# Patient Record
Sex: Female | Born: 1980 | Race: White | Hispanic: No | Marital: Married | Smoking: Never smoker
Health system: Southern US, Community
[De-identification: ages and names within clinical notes are randomized; demographics above are authoritative.]

## PROBLEM LIST (undated history)

## (undated) DIAGNOSIS — G43909 Migraine, unspecified, not intractable, without status migrainosus: Secondary | ICD-10-CM

## (undated) DIAGNOSIS — T8859XA Other complications of anesthesia, initial encounter: Secondary | ICD-10-CM

## (undated) DIAGNOSIS — F32A Depression, unspecified: Secondary | ICD-10-CM

## (undated) DIAGNOSIS — R112 Nausea with vomiting, unspecified: Secondary | ICD-10-CM

## (undated) DIAGNOSIS — E785 Hyperlipidemia, unspecified: Secondary | ICD-10-CM

## (undated) DIAGNOSIS — K219 Gastro-esophageal reflux disease without esophagitis: Secondary | ICD-10-CM

## (undated) DIAGNOSIS — F329 Major depressive disorder, single episode, unspecified: Secondary | ICD-10-CM

## (undated) DIAGNOSIS — Z9889 Other specified postprocedural states: Secondary | ICD-10-CM

## (undated) DIAGNOSIS — Z87442 Personal history of urinary calculi: Secondary | ICD-10-CM

## (undated) DIAGNOSIS — I10 Essential (primary) hypertension: Secondary | ICD-10-CM

## (undated) DIAGNOSIS — Z808 Family history of malignant neoplasm of other organs or systems: Secondary | ICD-10-CM

## (undated) DIAGNOSIS — I499 Cardiac arrhythmia, unspecified: Secondary | ICD-10-CM

## (undated) DIAGNOSIS — F909 Attention-deficit hyperactivity disorder, unspecified type: Secondary | ICD-10-CM

## (undated) HISTORY — DX: Migraine, unspecified, not intractable, without status migrainosus: G43.909

## (undated) HISTORY — DX: Family history of malignant neoplasm of other organs or systems: Z80.8

## (undated) HISTORY — DX: Hyperlipidemia, unspecified: E78.5

## (undated) HISTORY — PX: MOLE REMOVAL: SHX2046

## (undated) HISTORY — DX: Attention-deficit hyperactivity disorder, unspecified type: F90.9

---

## 2003-08-08 ENCOUNTER — Other Ambulatory Visit: Admission: RE | Admit: 2003-08-08 | Discharge: 2003-08-08 | Payer: Self-pay | Admitting: Gynecology

## 2005-09-02 ENCOUNTER — Ambulatory Visit: Payer: Self-pay | Admitting: Internal Medicine

## 2005-11-26 ENCOUNTER — Other Ambulatory Visit: Admission: RE | Admit: 2005-11-26 | Discharge: 2005-11-26 | Payer: Self-pay | Admitting: Gynecology

## 2006-05-27 ENCOUNTER — Ambulatory Visit: Payer: Self-pay | Admitting: Internal Medicine

## 2006-05-29 ENCOUNTER — Ambulatory Visit: Payer: Self-pay | Admitting: Internal Medicine

## 2006-11-12 ENCOUNTER — Ambulatory Visit: Payer: Self-pay | Admitting: Internal Medicine

## 2007-01-12 ENCOUNTER — Other Ambulatory Visit: Admission: RE | Admit: 2007-01-12 | Discharge: 2007-01-12 | Payer: Self-pay | Admitting: Gynecology

## 2008-07-19 ENCOUNTER — Telehealth: Payer: Self-pay | Admitting: Internal Medicine

## 2008-07-20 ENCOUNTER — Ambulatory Visit: Payer: Self-pay | Admitting: Internal Medicine

## 2008-07-20 DIAGNOSIS — G43009 Migraine without aura, not intractable, without status migrainosus: Secondary | ICD-10-CM | POA: Insufficient documentation

## 2008-07-26 ENCOUNTER — Telehealth (INDEPENDENT_AMBULATORY_CARE_PROVIDER_SITE_OTHER): Payer: Self-pay | Admitting: *Deleted

## 2008-08-04 ENCOUNTER — Encounter: Payer: Self-pay | Admitting: Internal Medicine

## 2009-12-12 ENCOUNTER — Ambulatory Visit: Payer: Self-pay | Admitting: Internal Medicine

## 2009-12-12 DIAGNOSIS — L03039 Cellulitis of unspecified toe: Secondary | ICD-10-CM | POA: Insufficient documentation

## 2009-12-12 DIAGNOSIS — B351 Tinea unguium: Secondary | ICD-10-CM | POA: Insufficient documentation

## 2010-07-06 ENCOUNTER — Ambulatory Visit: Payer: Self-pay | Admitting: Internal Medicine

## 2010-07-06 DIAGNOSIS — H9209 Otalgia, unspecified ear: Secondary | ICD-10-CM | POA: Insufficient documentation

## 2010-07-10 ENCOUNTER — Ambulatory Visit: Payer: Self-pay | Admitting: Family Medicine

## 2010-07-10 DIAGNOSIS — J019 Acute sinusitis, unspecified: Secondary | ICD-10-CM | POA: Insufficient documentation

## 2010-07-12 ENCOUNTER — Telehealth: Payer: Self-pay | Admitting: Family Medicine

## 2010-09-15 ENCOUNTER — Emergency Department (HOSPITAL_COMMUNITY)
Admission: EM | Admit: 2010-09-15 | Discharge: 2010-09-15 | Payer: Self-pay | Source: Home / Self Care | Admitting: Family Medicine

## 2010-11-11 ENCOUNTER — Emergency Department (HOSPITAL_BASED_OUTPATIENT_CLINIC_OR_DEPARTMENT_OTHER)
Admission: EM | Admit: 2010-11-11 | Discharge: 2010-11-11 | Payer: Self-pay | Source: Home / Self Care | Admitting: Emergency Medicine

## 2010-12-11 NOTE — Assessment & Plan Note (Signed)
Summary: RIGHT EAR PAIN/RH.....   Vital Signs:  Patient profile:   30 year old female Weight:      141.8 pounds Temp:     98.4 degrees F oral Pulse rate:   72 / minute Resp:     16 per minute BP sitting:   102 / 68  (left arm) Cuff size:   regular  Vitals Entered By: Shonna Chock CMA (July 06, 2010 11:52 AM) CC: Constant right ear pain since last night, URI symptoms   CC:  Constant right ear pain since last night and URI symptoms.  History of Present Illness:        This is a 30 year old woman who presents with R otalgia since last night . The patient reports earache, but denies nasal congestion, sore throat, and productive cough.  The patient denies fever, dyspnea, and wheezing.  The patient denies itchy watery eyes, sneezing, and headache.  The patient denies the following risk factors for Strep sinusitis: unilateral facial pain, tooth pain, and tender adenopathy.  Rx: none. ? worse with b'fast this am.  Allergies: 1)  ! Codeine  Review of Systems ENT:  Denies decreased hearing, difficulty swallowing, hoarseness, and ringing in ears.  Physical Exam  General:  well-nourished,in no acute distress; alert,appropriate and cooperative throughout examination Ears:  External ear exam shows no significant lesions or deformities.  Otoscopic examination reveals clear canals, tympanic membranes are intact bilaterally without bulging, retraction, inflammation or discharge. Hearing is grossly normal bilaterally. Nose:  External nasal examination shows no deformity or inflammation. Nasal mucosa are pink and moist without lesions or exudates. Mouth:  Oral mucosa and oropharynx without lesions or exudates.  Teeth in good repair. Some pain with mastication Lungs:  Normal respiratory effort, chest expands symmetrically. Lungs are clear to auscultation, no crackles or wheezes. Cervical Nodes:  No lymphadenopathy noted Axillary Nodes:  No palpable lymphadenopathy   Impression &  Recommendations:  Problem # 1:  EAR PAIN, RIGHT (ICD-388.70) probable TMJ The following medications were removed from the medication list:    Cephalexin 500 Mg Caps (Cephalexin) .Marland Kitchen... 1 two times a day for skin infections  Complete Medication List: 1)  Relpax 40 Mg Tabs (Eletriptan hydrobromide) .... Take as directed 2)  Prenatal Vit  .Marland KitchenMarland Kitchen. 1 by mouth once daily 3)  Zonisamide 100 Mg Caps (Zonisamide) .... 2 by mouth at bedtime for migraine prevention 4)  Folic Acid 40mg   .... 1 by mouth once daily 5)  Celebrex 200 Mg Caps (Celecoxib) .Marland Kitchen.. 1 two times a day as needed  Patient Instructions: 1)  Soft diet ; heat to area three times a day as needed . Fill the ear drops only if signs  of infection appear. Prescriptions: CELEBREX 200 MG CAPS (CELECOXIB) 1 two times a day as needed  #12 x 0   Entered and Authorized by:   Marga Melnick MD   Signed by:   Marga Melnick MD on 07/06/2010   Method used:   Samples Given   RxID:   [id number]

## 2010-12-11 NOTE — Assessment & Plan Note (Signed)
Summary: SINUS INFECTION??/KN   Vital Signs:  Patient profile:   30 year old female Weight:      141.6 pounds O2 Sat:      98 % on Room air Temp:     99.0 degrees F oral Pulse rate:   90 / minute Pulse rhythm:   regular BP sitting:   120 / 74  (right arm)  Vitals Entered By: Almeta Monas CMA Duncan Dull) (July 10, 2010 2:54 PM)  O2 Flow:  Room air CC: c/o cough, congestion, sinus pressure and right ear pain, URI symptoms   History of Present Illness:       This is a 30 year old woman who presents with URI symptoms.  The symptoms began 1 week ago.  The patient complains of nasal congestion, purulent nasal discharge, sore throat, productive cough, earache, and sick contacts.  The patient denies fever, low-grade fever (<100.5 degrees), fever of 100.5-103 degrees, fever of 103.1-104 degrees, fever to >104 degrees, stiff neck, dyspnea, wheezing, rash, vomiting, diarrhea, use of an antipyretic, and response to antipyretic.  The patient also reports sneezing and headache.  The patient denies the following risk factors for Strep sinusitis: unilateral facial pain, unilateral nasal discharge, poor response to decongestant, double sickening, tooth pain, Strep exposure, tender adenopathy, and absence of cough.    Current Medications (verified): 1)  Relpax 40 Mg  Tabs (Eletriptan Hydrobromide) .... Take As Directed 2)  Prenatal Vit .Marland Kitchen.. 1 By Mouth Once Daily 3)  Zonisamide 100 Mg Caps (Zonisamide) .... 2 By Mouth At Bedtime For Migraine Prevention 4)  Folic Acid 40mg  .... 1 By Mouth Once Daily 5)  Celebrex 200 Mg Caps (Celecoxib) .Marland Kitchen.. 1 Two Times A Day As Needed 6)  Augmentin 875-125 Mg Tabs (Amoxicillin-Pot Clavulanate) .Marland Kitchen.. 1 By Mouth Two Times A Day 7)  Veramyst 27.5 Mcg/spray Susp (Fluticasone Furoate) .... 2 Sprays Each Nostril Once Daily 8)  Mucinex Dm 30-600 Mg Xr12h-Tab (Dextromethorphan-Guaifenesin) 9)  Vicks Nyquil Multi-Symptom 15-6.25-500 Mg/82ml Liqd  (Dm-Doxylamine-Acetaminophen)  Allergies (verified): 1)  ! Codeine  Past History:  Past medical, surgical, family and social histories (including risk factors) reviewed for relevance to current acute and chronic problems.  Family History: Reviewed history and no changes required.  Social History: Reviewed history and no changes required.  Review of Systems      See HPI  Physical Exam  General:  Well-developed,well-nourished,in no acute distress; alert,appropriate and cooperative throughout examination Ears:  External ear exam shows no significant lesions or deformities.  Otoscopic examination reveals clear canals, tympanic membranes are intact bilaterally without bulging, retraction, inflammation or discharge. Hearing is grossly normal bilaterally. Nose:  L frontal sinus tenderness, L maxillary sinus tenderness, R frontal sinus tenderness, and R maxillary sinus tenderness.   Mouth:  Oral mucosa and oropharynx without lesions or exudates.  Teeth in good repair. Neck:  supple and cervical lymphadenopathy.   Lungs:  Normal respiratory effort, chest expands symmetrically. Lungs are clear to auscultation, no crackles or wheezes. Heart:  Normal rate and regular rhythm. S1 and S2 normal without gallop, murmur, click, rub or other extra sounds. Skin:  Intact without suspicious lesions or rashes Psych:  Cognition and judgment appear intact. Alert and cooperative with normal attention span and concentration. No apparent delusions, illusions, hallucinations   Impression & Recommendations:  Problem # 1:  SINUSITIS - ACUTE-NOS (ICD-461.9)  Her updated medication list for this problem includes:    Augmentin 875-125 Mg Tabs (Amoxicillin-pot clavulanate) .Marland Kitchen... 1 by mouth two times a  day    Veramyst 27.5 Mcg/spray Susp (Fluticasone furoate) .Marland Kitchen... 2 sprays each nostril once daily    Mucinex Dm 30-600 Mg Xr12h-tab (Dextromethorphan-guaifenesin)    Vicks Nyquil Multi-symptom 15-6.25-500 Mg/64ml  Liqd (Dm-doxylamine-acetaminophen)  Instructed on treatment. Call if symptoms persist or worsen.   Complete Medication List: 1)  Relpax 40 Mg Tabs (Eletriptan hydrobromide) .... Take as directed 2)  Prenatal Vit  .Marland KitchenMarland Kitchen. 1 by mouth once daily 3)  Zonisamide 100 Mg Caps (Zonisamide) .... 2 by mouth at bedtime for migraine prevention 4)  Folic Acid 40mg   .... 1 by mouth once daily 5)  Celebrex 200 Mg Caps (Celecoxib) .Marland Kitchen.. 1 two times a day as needed 6)  Augmentin 875-125 Mg Tabs (Amoxicillin-pot clavulanate) .Marland Kitchen.. 1 by mouth two times a day 7)  Veramyst 27.5 Mcg/spray Susp (Fluticasone furoate) .... 2 sprays each nostril once daily 8)  Mucinex Dm 30-600 Mg Xr12h-tab (Dextromethorphan-guaifenesin) 9)  Vicks Nyquil Multi-symptom 15-6.25-500 Mg/29ml Liqd (Dm-doxylamine-acetaminophen)  Patient Instructions: 1)  Acute sinusitis symptoms for less than 10 days are not helped by antibiotics. Use warm moist compresses, and over the counter decongestants( only as directed). Call if no improvement in 5-7 days, sooner if increasing pain, fever, or new symptoms.  Prescriptions: AUGMENTIN 875-125 MG TABS (AMOXICILLIN-POT CLAVULANATE) 1 by mouth two times a day  #20 x 0   Entered and Authorized by:   Loreen Freud DO   Signed by:   Loreen Freud DO on 07/10/2010   Method used:   Electronically to        [facility] [phone]* (retail)       [address]       [address]       [place], [place]  [address]       Ph: [phone]       Fax: [phone]   RxID:   [id number]

## 2010-12-11 NOTE — Progress Notes (Signed)
Summary: yeast infection  Phone Note Call from Patient Call back at Home Phone [phone]   Caller: Patient Summary of Call: pt left VM that whenever she takes antibiotics she gets a yeast infection. pt would like to get a rx for 2 diflucan sent in to pharmacy.pls advise................Marland KitchenFelecia Deloach CMA  July 12, 2010 10:28 AM    Follow-up for Phone Call        diflucan 150  #2  1 by mouth x1,  may repeat 3 days as needed  Follow-up by: Loreen Freud DO,  July 12, 2010 10:34 AM  Additional Follow-up for Phone Call Additional follow up Details #1::        Patient notified. Additional Follow-up by: Lucious Groves CMA,  July 12, 2010 11:11 AM    New/Updated Medications: DIFLUCAN 150 MG TABS (FLUCONAZOLE) 1 by mouth once daily Prescriptions: DIFLUCAN 150 MG TABS (FLUCONAZOLE) 1 by mouth once daily  #2 x 1   Entered by:   Lucious Groves CMA   Authorized by:   Loreen Freud DO   Signed by:   Lucious Groves CMA on 07/12/2010   Method used:   Electronically to        [facility] [id number] (retail)       [address] [place], [place]  [address]       Ph: [phone]       Fax: [phone]   RxID:   [id number]

## 2010-12-11 NOTE — Assessment & Plan Note (Signed)
Summary: infected toenail/kdc   Vital Signs:  Patient profile:   30 year old female Weight:      131.6 pounds Temp:     98.1 degrees F oral Pulse rate:   72 / minute Resp:     12 per minute BP sitting:   102 / 66  (left arm) Cuff size:   large  Vitals Entered By: Shonna Chock (December 12, 2009 12:16 PM) CC: ? infection/fungus on middle toe/right foot Comments REVIEWED MED LIST, PATIENT AGREED DOSE AND INSTRUCTION CORRECT    CC:  ? infection/fungus on middle toe/right foot.  History of Present Illness: 3rd R toe infected with fungus since 09/201 after Pedicure in 08/10. Pus from subungal area sinnce 12/07/2009. OTC meds w/o relief; H2O2 resolved pus.  Allergies: 1)  ! Codeine  Review of Systems General:  Denies chills, fever, and sweats. Derm:  Complains of changes in color of skin.  Physical Exam  General:  well-nourished,in no acute distress; alert,appropriate and cooperative throughout examination Pulses:  R dorsalis pedis and posterior tibial pulses are full  Extremities:  Mild fungal change 3rd R toenail; essentally resolved skin changes laterally to nail   Impression & Recommendations:  Problem # 1:  ONYCHOMYCOSIS (ICD-110.1) mild  Problem # 2:  PARONYCHIA, TOE (ICD-681.11)  essentially resolved  Her updated medication list for this problem includes:    Cephalexin 500 Mg Caps (Cephalexin) .Marland Kitchen... 1 two times a day for skin infections  Complete Medication List: 1)  Relpax 40 Mg Tabs (Eletriptan hydrobromide) .... Take as directed 2)  Yasmin 28 3-0.03 Mg Tabs (Drospirenone-ethinyl estradiol) .Marland Kitchen.. 1 by mouth as directed 3)  Daily Multiple Vitamins Tabs (Multiple vitamin) .Marland Kitchen.. 1 by mouth once daily 4)  Magnesium 250 Mg Tabs (Magnesium) .Marland Kitchen.. 1 by mouth once daily 5)  Topamax 100 Mg Tabs (Topiramate) .Marland Kitchen.. 1 by mouth once daily 6)  Citalopram Hydrobromide 10 Mg Tabs (Citalopram hydrobromide) .Marland Kitchen.. 1 by mouth once daily 7)  Bactroban 2 % Crea (Mupirocin calcium)  .... Apply once daily as needed 8)  Cephalexin 500 Mg Caps (Cephalexin) .Marland Kitchen.. 1 two times a day for skin infections  Patient Instructions: 1)  Hair drier once daily until nail normal. Prescriptions: CEPHALEXIN 500 MG CAPS (CEPHALEXIN) 1 two times a day for skin infections  #14 x 0   Entered and Authorized by:   Marga Melnick MD   Signed by:   Marga Melnick MD on 12/12/2009   Method used:   Print then Give to Patient   RxID:   [id number] BACTROBAN 2 % CREA (MUPIROCIN CALCIUM) apply once daily as needed  #15 grams x 0   Entered and Authorized by:   Marga Melnick MD   Signed by:   Marga Melnick MD on 12/12/2009   Method used:   Print then Give to Patient   RxID:   [id number]

## 2011-01-21 LAB — URINE MICROSCOPIC-ADD ON

## 2011-01-21 LAB — URINALYSIS, ROUTINE W REFLEX MICROSCOPIC
Bilirubin Urine: NEGATIVE
Specific Gravity, Urine: 1.025 (ref 1.005–1.030)
pH: 5.5 (ref 5.0–8.0)

## 2011-04-24 ENCOUNTER — Encounter: Payer: Self-pay | Admitting: Internal Medicine

## 2011-04-24 ENCOUNTER — Ambulatory Visit (INDEPENDENT_AMBULATORY_CARE_PROVIDER_SITE_OTHER): Payer: BC Managed Care – PPO | Admitting: Internal Medicine

## 2011-04-24 VITALS — BP 120/70 | HR 72 | Wt 156.6 lb

## 2011-04-24 DIAGNOSIS — R002 Palpitations: Secondary | ICD-10-CM

## 2011-04-24 NOTE — Patient Instructions (Addendum)
An EVENT  Monitor will be scheduled if labs are normal & symptoms persist or progress.

## 2011-04-24 NOTE — Progress Notes (Signed)
  Subjective:    Patient ID: Rebecca Armstrong, female    DOB: Mar 20, 1981, 30 y.o.   MRN: [id number]  HPI Palpitations Onset:?2002; exacerbation 11/2010 w/o specific trigger Trigger:her father had prolonged illness 2000-2003 which was stressful; episodes are not worse with CVE Pattern:1-2 episodes for 20 seconds am & eve, especially  during 2 weeks pre menses. Symptoms are not daily & may not occur some weeks Constitutional: no fever, chills, sweats, weight loss, fatigue, sleep issues Cardiovascular:no chest pain, syncope, diaphoresis, claudication GI:no change in bowels, anorexia Derm:no skin, hair, or nail changes Neurologic:no tremor, numbnes#1 palpitations and tingling Psych:no  Significant anxiety, depression, panic attacks Endocrine:no hoarseness,difficulty swallowing, temperature intolerance (except when on migraine preventative she stayed cold) Possible triggers:no new medications,no excess intake of  stimulants(decongestants, diet pills, nicotine, caffeine)  FH: MGM CAD;MGM "hole in heart"; cousin died @35   while running, presumably from dysrrhythmia   Review of Systems     Objective:   Physical Exam  Gen.: Healthy & but well-nourished; in no acute distress Eyes: Extraocular motion intact; no lid lag or proptosis Neck: full ROM, no masses , thyroid normal Heart: Normal rhythm and rate without significant murmur, gallop, or extra heart sounds Lungs: Chest clear to auscultation without rales,rales, wheezes Neuro:Deep tendon reflexes are equal and within normal limits; no tremor  Skin: Warm and dry without significant lesions or rashes; no onycholysis Psych: Normally communicative and interactive; no abnormal mood or affect clinically.         Assessment & Plan:  #1 palpitations Plan: EKG, Ca++, Mg++, K+, TFTs Note: EKG revealed PACs. Stimulants should be avoided. If symptoms persist & labs are normalo; an Event Holter monitor would be necessary as episodes are erratic.

## 2011-04-25 LAB — MAGNESIUM: Magnesium: 2.2 mg/dL (ref 1.5–2.5)

## 2011-05-27 ENCOUNTER — Other Ambulatory Visit: Payer: Self-pay | Admitting: Internal Medicine

## 2011-07-11 ENCOUNTER — Other Ambulatory Visit (HOSPITAL_COMMUNITY): Payer: Self-pay | Admitting: Gynecology

## 2011-07-11 DIAGNOSIS — N979 Female infertility, unspecified: Secondary | ICD-10-CM

## 2011-07-22 ENCOUNTER — Ambulatory Visit (HOSPITAL_COMMUNITY): Payer: BC Managed Care – PPO

## 2011-07-25 ENCOUNTER — Ambulatory Visit (HOSPITAL_COMMUNITY)
Admission: RE | Admit: 2011-07-25 | Discharge: 2011-07-25 | Disposition: A | Discharge status: Home / Self Care | Payer: BC Managed Care – PPO | Source: Ambulatory Visit | Attending: Gynecology | Admitting: Gynecology

## 2011-07-25 DIAGNOSIS — N979 Female infertility, unspecified: Secondary | ICD-10-CM | POA: Insufficient documentation

## 2011-07-25 MED ORDER — IOHEXOL 300 MG/ML  SOLN: 10.0000 mL | Freq: Once | INTRAMUSCULAR | Status: AC | PRN

## 2011-11-12 HISTORY — PX: OTHER SURGICAL HISTORY: SHX169

## 2011-11-13 ENCOUNTER — Ambulatory Visit (INDEPENDENT_AMBULATORY_CARE_PROVIDER_SITE_OTHER): Payer: BC Managed Care – PPO | Admitting: Internal Medicine

## 2011-11-13 ENCOUNTER — Encounter: Payer: Self-pay | Admitting: Internal Medicine

## 2011-11-13 DIAGNOSIS — J069 Acute upper respiratory infection, unspecified: Secondary | ICD-10-CM

## 2011-11-13 DIAGNOSIS — J209 Acute bronchitis, unspecified: Secondary | ICD-10-CM

## 2011-11-13 MED ORDER — BENZONATATE 200 MG PO CAPS: 200.0000 mg | ORAL_CAPSULE | Freq: Two times a day (BID) | ORAL | Status: AC | PRN

## 2011-11-13 MED ORDER — AZITHROMYCIN 250 MG PO TABS: ORAL_TABLET | ORAL | Status: AC

## 2011-11-13 NOTE — Patient Instructions (Signed)
Plain Mucinex for thick secretions ;force NON dairy fluids. Use a Neti pot daily as needed for sinus congestion  

## 2011-11-13 NOTE — Progress Notes (Signed)
  Subjective:    Patient ID: Rebecca Armstrong, female    DOB: 1981-10-24, 31 y.o.   MRN: [id number]  HPI Respiratory tract infection Onset/symptoms:12/29 as scratchy throat with dry cough Exposures (illness/environmental/extrinsic):ill family members Progression of symptoms:to malaise , temp to 100 Treatments/response:Mucinex helps Present symptoms: Fever/chills/sweats:no Frontal headache:no Facial pain:no Nasal purulence:no Sore throat:minor Dental pain:some 12/31 Lymphadenopathy:no Wheezing/shortness of breath:minor SOB this am Cough/sputum/hemoptysis:sputum not visualized Pleuritic pain:no Associated extrinsic/allergic symptoms:itchy eyes/ sneezing:no Past medical history: Seasonal allergies: yes/asthma:no Smoking history:no           Review of Systems     Objective:   Physical Exam General appearance:good health ;well nourished; no acute distress or increased work of breathing is present.  No  lymphadenopathy about the head, neck, or axilla noted.   Eyes: No conjunctival inflammation or lid edema is present.   Ears:  External ear exam shows no significant lesions or deformities.  Otoscopic examination reveals clear canals, tympanic membranes are intact bilaterally without bulging, retraction, inflammation or discharge.  Nose:  External nasal examination shows no deformity or inflammation. Nasal mucosa are dry without lesions or exudates. No septal dislocation or deviation.No obstruction to airflow.  Slight hyponasal speech  Oral exam: Dental hygiene is good; lips and gums are healthy appearing.There is no oropharyngeal erythema or exudate noted.     Heart:  Normal rate and regular rhythm. S1 and S2 normal without gallop, murmur, click, rub or other extra sounds.   Lungs:Chest clear to auscultation; no wheezes, rhonchi,rales ,or rubs present.No increased work of breathing.  Dry cough  Extremities:  No cyanosis, edema, or clubbing  noted    Skin: Warm & dry           Assessment & Plan:   #1 bronchitis, acute. Atypical organism suggested. Upper respiratory tract symptoms without definitive rhinosinusitis criteria  Plan: See orders recommendations

## 2011-11-20 ENCOUNTER — Other Ambulatory Visit: Payer: Self-pay | Admitting: Internal Medicine

## 2011-11-20 MED ORDER — ELETRIPTAN HYDROBROMIDE 40 MG PO TABS: ORAL_TABLET | ORAL | Status: DC

## 2011-11-20 NOTE — Telephone Encounter (Signed)
RX sent

## 2012-02-14 ENCOUNTER — Other Ambulatory Visit: Payer: Self-pay | Admitting: Internal Medicine

## 2012-02-26 LAB — OB RESULTS CONSOLE HEPATITIS B SURFACE ANTIGEN: Hepatitis B Surface Ag: NEGATIVE

## 2012-06-08 IMAGING — RF DG HYSTEROGRAM
2 series · 2 of 2 positions shown · non-contrast
Comparison: none

CLINICAL DATA: Infertility.

HYSTEROSALPINGOGRAM
TECHNIQUE: Hysterosalpingogram was performed by the ordering
physician under fluoroscopy.  Fluoroscopic images are submitted for
interpretation following the procedure.
Fluoroscopy Time:  2.0 minutes.

[Series 1: run · 1 of 1 slices shown (1 of 2)]
[im 1/1]
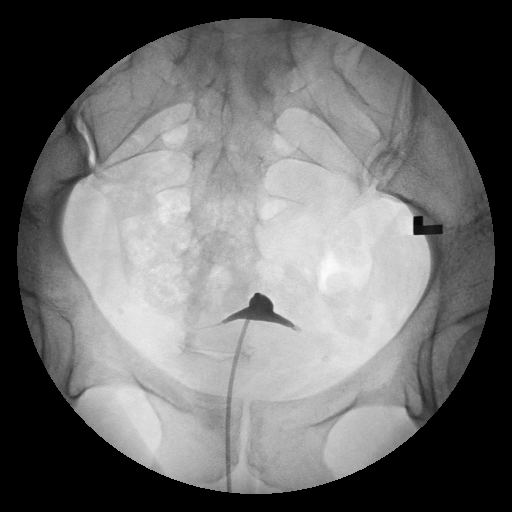

[Series 2: run · 1 of 1 slices shown (2 of 2)]
[im 1/1]
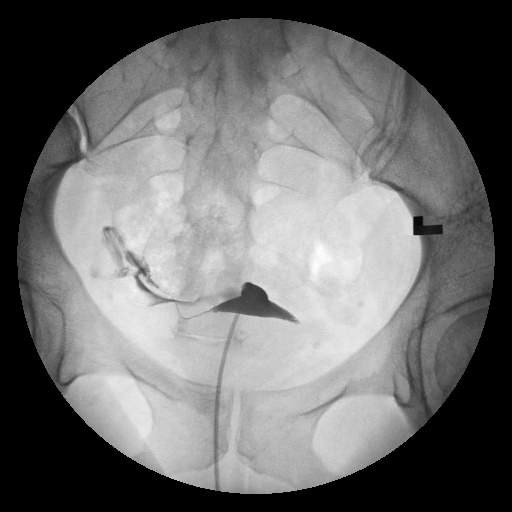

[2 of 2 positions shown; findings below may reference images not displayed]

FINDINGS: Only two images were submitted for evaluation.  A normal
endometrial morphology is noted.  The right fallopian tube has a
normal morphology and free intraperitoneal spill is seen on this
side with no loculation of contrast to suggest the presence of
peritubal or periovarian adhesions.

No filling of the left fallopian tube is noted past the level of
the cornual-tubal junction.  This can be the result of complete
occlusion but may also be seen with unilateral spasm.
IMPRESSION: Normal endometrial morphology and right tubal patency confirmed.
No filling beyond the left cornual-tubal junction, question spasm
versus occlusion

## 2012-08-22 ENCOUNTER — Encounter (HOSPITAL_COMMUNITY): Payer: Self-pay | Admitting: Pharmacist

## 2012-08-25 ENCOUNTER — Inpatient Hospital Stay (HOSPITAL_COMMUNITY): Admission: AD | Admit: 2012-08-25 | Payer: Self-pay | Source: Ambulatory Visit | Admitting: Obstetrics and Gynecology

## 2012-08-28 ENCOUNTER — Encounter (HOSPITAL_COMMUNITY): Payer: Self-pay

## 2012-08-28 ENCOUNTER — Encounter (HOSPITAL_COMMUNITY)
Admission: RE | Admit: 2012-08-28 | Discharge: 2012-08-28 | Disposition: A | Discharge status: Home / Self Care | Payer: BC Managed Care – PPO | Source: Ambulatory Visit | Attending: Obstetrics and Gynecology | Admitting: Obstetrics and Gynecology

## 2012-08-28 HISTORY — DX: Cardiac arrhythmia, unspecified: I49.9

## 2012-08-28 LAB — CBC
HCT: 32.7 % — ABNORMAL LOW (ref 36.0–46.0)
Hemoglobin: 10.6 g/dL — ABNORMAL LOW (ref 12.0–15.0)
RBC: 4.31 MIL/uL (ref 3.87–5.11)
RDW: 15.5 % (ref 11.5–15.5)
WBC: 6.5 10*3/uL (ref 4.0–10.5)

## 2012-08-28 LAB — SURGICAL PCR SCREEN
MRSA, PCR: NEGATIVE
Staphylococcus aureus: POSITIVE — AB

## 2012-08-28 LAB — TYPE AND SCREEN: Antibody Screen: NEGATIVE

## 2012-08-28 NOTE — Patient Instructions (Addendum)
   Your procedure is scheduled AV:[id number] October 22nd  Enter through the Main Entrance of [facility] at:8:30am Pick up the phone at the desk and dial [phone] and inform us of your arrival.  Please call this number if you have any problems the morning of surgery: [phone]  Remember: Do not eat or drink anything after midnight on Monday   Do not wear jewelry, make-up, or FINGER nail polish No metal in your hair or on your body. Do not wear lotions, powders, perfumes. You may wear deodorant.  Please use your CHG wash as directed prior to surgery.  Do not shave anywhere for at least 12 hours prior to first CHG shower.  Do not bring valuables to the hospital.   Leave suitcase in the car. After Surgery it may be brought to your room. For patients being admitted to the hospital, checkout time is 11:00am the day of discharge.

## 2012-08-30 NOTE — H&P (Signed)
Rebecca Armstrong is a 31 y.o. female G1P0 at 37+weeks (EDD 09/21/12 by known date of conception with IVF) presenting for scheduled primary c-section given vtx/breech twins.  She has had a relatively uncomplicated prenatal course, with di/di IVF twins.  Twin A has had mild pyelectasis on [place] that has been stable and will need to be followed postnatally. Growth has been concordant.  Maternal Medical History:  Fetal activity: Perceived fetal activity is normal.      OB History    Grav Para Term Preterm Abortions TAB SAB Ect Mult Living   1         0     Past Medical History  Diagnosis Date  . Newborn product of IVF pregnancy   . Headache   . Dysrhythmia     history of palpitations-ekg and workup in 04/2011--NSR with PAC's-avoid stimulants   Past Surgical History  Procedure Date  . Egg retrieval 2013    IVF-Propofol   Family History: family history is not on file. Social History:  reports that she has never smoked. She does not have any smokeless tobacco history on file. She reports that she does not drink alcohol or use illicit drugs.   Prenatal Transfer Tool  Maternal Diabetes: No Genetic Screening: Normal Maternal Ultrasounds/Referrals: Abnormal:  Findings:   Fetal renal pyelectasis--Twin "a"  Needs postnatal f/u Fetal Ultrasounds or other Referrals:  None Maternal Substance Abuse:  No Significant Maternal Medications:  None Significant Maternal Lab Results:  None Other Comments:  None  Review of Systems  Neurological: Negative for headaches.      Height 5\' 4"  (1.626 m), weight 95.255 kg (210 lb), last menstrual period 12/18/2011. Maternal Exam:  Uterine Assessment: Contraction strength is mild.  Contraction frequency is irregular.   Abdomen: Patient reports no abdominal tenderness. Introitus: Normal vulva. Normal vagina.    Physical Exam  Constitutional: She is oriented to person, place, and time. She appears well-developed and well-nourished.  Cardiovascular: Normal  rate and regular rhythm.   Respiratory: Effort normal and breath sounds normal.  GI: Soft. Bowel sounds are normal.  Genitourinary: Vagina normal and uterus normal.  Neurological: She is alert and oriented to person, place, and time.  Psychiatric: She has a normal mood and affect. Her behavior is normal.    Prenatal labs: ABO, Rh: --/--/O POS (10/18 1215) Antibody: NEG (10/18 1210) Rubella: Immune (04/17 0000) RPR: NON REACTIVE (10/18 1210)  HBsAg: Negative (04/17 0000)  HIV: Non-reactive (04/17 0000)  GBS:   negative One hour  GTT 145 Three hour GTT WNL  Assessment/Plan: Pt was counseled as to her options of vaginal deivery with breech extraction of twin B vs c-section.  After careful consideration, she would like to proceed with C-section.  We discussed the procedure in detail, including risks of bleeding, infection and possible damage to bowel and bladder. Pt ready to proceed.  Oliver Pila 08/30/2012, 11:49 AM

## 2012-09-01 ENCOUNTER — Encounter (HOSPITAL_COMMUNITY): Payer: Self-pay | Admitting: *Deleted

## 2012-09-01 ENCOUNTER — Encounter (HOSPITAL_COMMUNITY)
Admission: AD | Disposition: A | Discharge status: Home / Self Care | Payer: Self-pay | Source: Ambulatory Visit | Attending: Obstetrics and Gynecology

## 2012-09-01 ENCOUNTER — Inpatient Hospital Stay (HOSPITAL_COMMUNITY)
Admission: AD | Admit: 2012-09-01 | Discharge: 2012-09-04 | DRG: 371 | Disposition: A | Discharge status: Home / Self Care | Payer: BC Managed Care – PPO | Source: Ambulatory Visit | Attending: Obstetrics and Gynecology | Admitting: Obstetrics and Gynecology

## 2012-09-01 ENCOUNTER — Inpatient Hospital Stay (HOSPITAL_COMMUNITY): Payer: BC Managed Care – PPO | Admitting: Registered Nurse

## 2012-09-01 ENCOUNTER — Encounter (HOSPITAL_COMMUNITY): Payer: Self-pay | Admitting: Registered Nurse

## 2012-09-01 DIAGNOSIS — O358XX0 Maternal care for other (suspected) fetal abnormality and damage, not applicable or unspecified: Secondary | ICD-10-CM | POA: Diagnosis present

## 2012-09-01 DIAGNOSIS — O30009 Twin pregnancy, unspecified number of placenta and unspecified number of amniotic sacs, unspecified trimester: Secondary | ICD-10-CM | POA: Diagnosis present

## 2012-09-01 DIAGNOSIS — O3090 Multiple gestation, unspecified, unspecified trimester: Principal | ICD-10-CM | POA: Diagnosis present

## 2012-09-01 DIAGNOSIS — O30049 Twin pregnancy, dichorionic/diamniotic, unspecified trimester: Secondary | ICD-10-CM

## 2012-09-01 DIAGNOSIS — Z98891 History of uterine scar from previous surgery: Secondary | ICD-10-CM

## 2012-09-01 LAB — CBC
Hemoglobin: 10.6 g/dL — ABNORMAL LOW (ref 12.0–15.0)
MCH: 24.8 pg — ABNORMAL LOW (ref 26.0–34.0)
MCV: 76.1 fL — ABNORMAL LOW (ref 78.0–100.0)
RBC: 4.27 MIL/uL (ref 3.87–5.11)

## 2012-09-01 LAB — TYPE AND SCREEN: ABO/RH(D): O POS

## 2012-09-01 SURGERY — Surgical Case
Anesthesia: Spinal | Site: Abdomen | Wound class: Clean Contaminated

## 2012-09-01 MED ORDER — KETOROLAC TROMETHAMINE 60 MG/2ML IM SOLN
INTRAMUSCULAR | Status: AC
  Administered 2012-09-01: 60 mg
  Filled 2012-09-01: qty 2

## 2012-09-01 MED ORDER — ONDANSETRON HCL 4 MG/2ML IJ SOLN: 4.0000 mg | Freq: Three times a day (TID) | INTRAMUSCULAR | Status: DC | PRN

## 2012-09-01 MED ORDER — CEFAZOLIN SODIUM-DEXTROSE 2-3 GM-% IV SOLR
INTRAVENOUS | Status: AC
  Filled 2012-09-01: qty 50

## 2012-09-01 MED ORDER — HYDROMORPHONE HCL PF 1 MG/ML IJ SOLN
INTRAMUSCULAR | Status: AC
  Administered 2012-09-01: 0.5 mg via INTRAVENOUS
  Filled 2012-09-01: qty 1

## 2012-09-01 MED ORDER — PHENYLEPHRINE HCL 10 MG/ML IJ SOLN
INTRAMUSCULAR | Status: DC | PRN
  Administered 2012-09-01: 80 ug via INTRAVENOUS

## 2012-09-01 MED ORDER — CEFAZOLIN SODIUM-DEXTROSE 2-3 GM-% IV SOLR
2.0000 g | INTRAVENOUS | Status: AC
  Administered 2012-09-01: 2 g via INTRAVENOUS

## 2012-09-01 MED ORDER — LACTATED RINGERS IV SOLN
INTRAVENOUS | Status: DC | PRN
  Administered 2012-09-01: 10:00:00 via INTRAVENOUS

## 2012-09-01 MED ORDER — ONDANSETRON HCL 4 MG/2ML IJ SOLN: 4.0000 mg | INTRAMUSCULAR | Status: DC | PRN

## 2012-09-01 MED ORDER — KETOROLAC TROMETHAMINE 30 MG/ML IJ SOLN: 30.0000 mg | Freq: Four times a day (QID) | INTRAMUSCULAR | Status: AC | PRN

## 2012-09-01 MED ORDER — SIMETHICONE 80 MG PO CHEW
80.0000 mg | CHEWABLE_TABLET | Freq: Three times a day (TID) | ORAL | Status: DC
  Administered 2012-09-01 – 2012-09-04 (×9): 80 mg via ORAL

## 2012-09-01 MED ORDER — ZOLPIDEM TARTRATE 5 MG PO TABS: 5.0000 mg | ORAL_TABLET | Freq: Every evening | ORAL | Status: DC | PRN

## 2012-09-01 MED ORDER — DIPHENHYDRAMINE HCL 50 MG/ML IJ SOLN: 12.5000 mg | INTRAMUSCULAR | Status: DC | PRN

## 2012-09-01 MED ORDER — NALBUPHINE HCL 10 MG/ML IJ SOLN
5.0000 mg | INTRAMUSCULAR | Status: DC | PRN
  Filled 2012-09-01: qty 1

## 2012-09-01 MED ORDER — SODIUM CHLORIDE 0.9 % IJ SOLN: 3.0000 mL | INTRAMUSCULAR | Status: DC | PRN

## 2012-09-01 MED ORDER — PHENYLEPHRINE 40 MCG/ML (10ML) SYRINGE FOR IV PUSH (FOR BLOOD PRESSURE SUPPORT)
PREFILLED_SYRINGE | INTRAVENOUS | Status: AC
  Filled 2012-09-01: qty 5

## 2012-09-01 MED ORDER — BUPIVACAINE IN DEXTROSE 0.75-8.25 % IT SOLN
INTRATHECAL | Status: DC | PRN
  Administered 2012-09-01: 1.4 mL via INTRATHECAL

## 2012-09-01 MED ORDER — OXYTOCIN 10 UNIT/ML IJ SOLN
INTRAMUSCULAR | Status: AC
Start: 2012-09-01 — End: 2012-09-01
  Filled 2012-09-01: qty 4

## 2012-09-01 MED ORDER — SENNOSIDES-DOCUSATE SODIUM 8.6-50 MG PO TABS
2.0000 | ORAL_TABLET | Freq: Every day | ORAL | Status: DC
  Administered 2012-09-01 – 2012-09-03 (×3): 2 via ORAL

## 2012-09-01 MED ORDER — HYDROCORTISONE ACE-PRAMOXINE 1-1 % RE FOAM
1.0000 | Freq: Two times a day (BID) | RECTAL | Status: DC
  Administered 2012-09-01: 1 via RECTAL
  Filled 2012-09-01: qty 10

## 2012-09-01 MED ORDER — DIPHENHYDRAMINE HCL 25 MG PO CAPS
25.0000 mg | ORAL_CAPSULE | ORAL | Status: DC | PRN
  Filled 2012-09-01: qty 1

## 2012-09-01 MED ORDER — METOCLOPRAMIDE HCL 5 MG/ML IJ SOLN: 10.0000 mg | Freq: Three times a day (TID) | INTRAMUSCULAR | Status: DC | PRN

## 2012-09-01 MED ORDER — LACTATED RINGERS IV SOLN
INTRAVENOUS | Status: DC
  Administered 2012-09-01 (×3): via INTRAVENOUS

## 2012-09-01 MED ORDER — SODIUM CHLORIDE 0.9 % IV SOLN: 1.0000 ug/kg/h | INTRAVENOUS | Status: DC | PRN

## 2012-09-01 MED ORDER — ONDANSETRON HCL 4 MG PO TABS
4.0000 mg | ORAL_TABLET | ORAL | Status: DC | PRN
  Administered 2012-09-02: 4 mg via ORAL
  Filled 2012-09-01: qty 1

## 2012-09-01 MED ORDER — SCOPOLAMINE 1 MG/3DAYS TD PT72
MEDICATED_PATCH | TRANSDERMAL | Status: AC
  Administered 2012-09-01: 1.5 mg via TRANSDERMAL
  Filled 2012-09-01: qty 1

## 2012-09-01 MED ORDER — OXYCODONE-ACETAMINOPHEN 5-325 MG PO TABS
1.0000 | ORAL_TABLET | ORAL | Status: DC | PRN
  Administered 2012-09-02 – 2012-09-03 (×3): 1 via ORAL
  Filled 2012-09-01 (×3): qty 1

## 2012-09-01 MED ORDER — EPHEDRINE 5 MG/ML INJ
INTRAVENOUS | Status: AC
  Filled 2012-09-01: qty 10

## 2012-09-01 MED ORDER — ONDANSETRON HCL 4 MG/2ML IJ SOLN
INTRAMUSCULAR | Status: AC
  Filled 2012-09-01: qty 2

## 2012-09-01 MED ORDER — LANOLIN HYDROUS EX OINT: 1.0000 "application " | TOPICAL_OINTMENT | CUTANEOUS | Status: DC | PRN

## 2012-09-01 MED ORDER — DIPHENHYDRAMINE HCL 25 MG PO CAPS: 25.0000 mg | ORAL_CAPSULE | Freq: Four times a day (QID) | ORAL | Status: DC | PRN

## 2012-09-01 MED ORDER — NALOXONE HCL 0.4 MG/ML IJ SOLN: 0.4000 mg | INTRAMUSCULAR | Status: DC | PRN

## 2012-09-01 MED ORDER — SCOPOLAMINE 1 MG/3DAYS TD PT72: 1.0000 | MEDICATED_PATCH | Freq: Once | TRANSDERMAL | Status: DC

## 2012-09-01 MED ORDER — MENTHOL 3 MG MT LOZG: 1.0000 | LOZENGE | OROMUCOSAL | Status: DC | PRN

## 2012-09-01 MED ORDER — FENTANYL CITRATE 0.05 MG/ML IJ SOLN
INTRAMUSCULAR | Status: DC | PRN
  Administered 2012-09-01: 25 ug via INTRATHECAL

## 2012-09-01 MED ORDER — FENTANYL CITRATE 0.05 MG/ML IJ SOLN
INTRAMUSCULAR | Status: AC
  Filled 2012-09-01: qty 2

## 2012-09-01 MED ORDER — PRENATAL MULTIVITAMIN CH
1.0000 | ORAL_TABLET | Freq: Every day | ORAL | Status: DC
  Administered 2012-09-02 – 2012-09-04 (×3): 1 via ORAL
  Filled 2012-09-01 (×3): qty 1

## 2012-09-01 MED ORDER — SCOPOLAMINE 1 MG/3DAYS TD PT72
1.0000 | MEDICATED_PATCH | Freq: Once | TRANSDERMAL | Status: DC
  Administered 2012-09-01: 1.5 mg via TRANSDERMAL

## 2012-09-01 MED ORDER — LACTATED RINGERS IV SOLN: INTRAVENOUS | Status: DC

## 2012-09-01 MED ORDER — MEPERIDINE HCL 25 MG/ML IJ SOLN: 6.2500 mg | INTRAMUSCULAR | Status: DC | PRN

## 2012-09-01 MED ORDER — IBUPROFEN 600 MG PO TABS
600.0000 mg | ORAL_TABLET | Freq: Four times a day (QID) | ORAL | Status: DC
  Administered 2012-09-01 – 2012-09-04 (×11): 600 mg via ORAL
  Filled 2012-09-01 (×11): qty 1

## 2012-09-01 MED ORDER — DIBUCAINE 1 % RE OINT: 1.0000 "application " | TOPICAL_OINTMENT | RECTAL | Status: DC | PRN

## 2012-09-01 MED ORDER — OXYTOCIN 40 UNITS IN LACTATED RINGERS INFUSION - SIMPLE MED
INTRAVENOUS | Status: DC | PRN
  Administered 2012-09-01: 40 [IU] via INTRAVENOUS

## 2012-09-01 MED ORDER — LACTATED RINGERS IV SOLN
Freq: Once | INTRAVENOUS | Status: AC
  Administered 2012-09-01: 09:00:00 via INTRAVENOUS

## 2012-09-01 MED ORDER — SIMETHICONE 80 MG PO CHEW: 80.0000 mg | CHEWABLE_TABLET | ORAL | Status: DC | PRN

## 2012-09-01 MED ORDER — EPHEDRINE SULFATE 50 MG/ML IJ SOLN
INTRAMUSCULAR | Status: DC | PRN
Start: 2012-09-01 — End: 2012-09-01
  Administered 2012-09-01: 5 mg via INTRAVENOUS
  Administered 2012-09-01: 10 mg via INTRAVENOUS

## 2012-09-01 MED ORDER — OXYTOCIN 40 UNITS IN LACTATED RINGERS INFUSION - SIMPLE MED: 62.5000 mL/h | INTRAVENOUS | Status: AC

## 2012-09-01 MED ORDER — MORPHINE SULFATE 0.5 MG/ML IJ SOLN
INTRAMUSCULAR | Status: AC
  Filled 2012-09-01: qty 10

## 2012-09-01 MED ORDER — IBUPROFEN 600 MG PO TABS: 600.0000 mg | ORAL_TABLET | Freq: Four times a day (QID) | ORAL | Status: DC | PRN

## 2012-09-01 MED ORDER — ONDANSETRON HCL 4 MG/2ML IJ SOLN
INTRAMUSCULAR | Status: DC | PRN
  Administered 2012-09-01: 4 mg via INTRAVENOUS

## 2012-09-01 MED ORDER — HYDROMORPHONE HCL PF 1 MG/ML IJ SOLN
0.2500 mg | INTRAMUSCULAR | Status: DC | PRN
  Administered 2012-09-01: 0.5 mg via INTRAVENOUS

## 2012-09-01 MED ORDER — KETOROLAC TROMETHAMINE 60 MG/2ML IM SOLN
60.0000 mg | Freq: Once | INTRAMUSCULAR | Status: AC | PRN
  Filled 2012-09-01: qty 2

## 2012-09-01 MED ORDER — MORPHINE SULFATE (PF) 0.5 MG/ML IJ SOLN
INTRAMUSCULAR | Status: DC | PRN
  Administered 2012-09-01: .15 mg via INTRATHECAL

## 2012-09-01 MED ORDER — WITCH HAZEL-GLYCERIN EX PADS: 1.0000 "application " | MEDICATED_PAD | CUTANEOUS | Status: DC | PRN

## 2012-09-01 MED ORDER — TETANUS-DIPHTH-ACELL PERTUSSIS 5-2.5-18.5 LF-MCG/0.5 IM SUSP: 0.5000 mL | Freq: Once | INTRAMUSCULAR | Status: DC

## 2012-09-01 MED ORDER — DIPHENHYDRAMINE HCL 50 MG/ML IJ SOLN: 25.0000 mg | INTRAMUSCULAR | Status: DC | PRN

## 2012-09-01 SURGICAL SUPPLY — 31 items
CLOTH BEACON ORANGE TIMEOUT ST (SAFETY) ×2 IMPLANT
CONTAINER PREFILL 10% NBF 15ML (MISCELLANEOUS) IMPLANT
DRAPE SURG 17X23 STRL (DRAPES) ×2 IMPLANT
DRSG COVADERM 4X10 (GAUZE/BANDAGES/DRESSINGS) ×2 IMPLANT
DURAPREP 26ML APPLICATOR (WOUND CARE) ×2 IMPLANT
ELECT REM PT RETURN 9FT ADLT (ELECTROSURGICAL) ×2
ELECTRODE REM PT RTRN 9FT ADLT (ELECTROSURGICAL) ×1 IMPLANT
EXTRACTOR VACUUM KIWI (MISCELLANEOUS) IMPLANT
EXTRACTOR VACUUM M CUP 4 TUBE (SUCTIONS) IMPLANT
GLOVE BIO SURGEON STRL SZ 6.5 (GLOVE) ×4 IMPLANT
GLOVE BIO SURGEON STRL SZ8 (GLOVE) ×2 IMPLANT
GOWN PREVENTION PLUS LG XLONG (DISPOSABLE) ×4 IMPLANT
KIT ABG SYR 3ML LUER SLIP (SYRINGE) IMPLANT
NEEDLE HYPO 25X5/8 SAFETYGLIDE (NEEDLE) ×2 IMPLANT
NS IRRIG 1000ML POUR BTL (IV SOLUTION) ×2 IMPLANT
PACK C SECTION WH (CUSTOM PROCEDURE TRAY) ×2 IMPLANT
PAD OB MATERNITY 4.3X12.25 (PERSONAL CARE ITEMS) IMPLANT
RTRCTR C-SECT PINK 25CM LRG (MISCELLANEOUS) ×2 IMPLANT
SLEEVE SCD COMPRESS KNEE MED (MISCELLANEOUS) IMPLANT
STAPLER VISISTAT 35W (STAPLE) IMPLANT
SUT CHROMIC 1 CTX 36 (SUTURE) ×4 IMPLANT
SUT PLAIN 0 NONE (SUTURE) IMPLANT
SUT PLAIN 2 0 XLH (SUTURE) IMPLANT
SUT VIC AB 0 CT1 27 (SUTURE) ×2
SUT VIC AB 0 CT1 27XBRD ANBCTR (SUTURE) ×2 IMPLANT
SUT VIC AB 2-0 CT1 27 (SUTURE)
SUT VIC AB 2-0 CT1 TAPERPNT 27 (SUTURE) IMPLANT
SUT VIC AB 4-0 KS 27 (SUTURE) IMPLANT
TOWEL OR 17X24 6PK STRL BLUE (TOWEL DISPOSABLE) ×4 IMPLANT
TRAY FOLEY CATH 14FR (SET/KITS/TRAYS/PACK) ×2 IMPLANT
WATER STERILE IRR 1000ML POUR (IV SOLUTION) ×2 IMPLANT

## 2012-09-01 NOTE — Anesthesia Postprocedure Evaluation (Signed)
  Anesthesia Post-op Note  Patient: Rebecca Armstrong  Procedure(s) Performed: Procedure(s) (LRB) with comments: CESAREAN SECTION (N/A) - Primay c/s-TWINS  Patient Location: PACU  Anesthesia Type: Spinal  Level of Consciousness: awake, alert  and oriented  Airway and Oxygen Therapy: Patient Spontanous Breathing  Post-op Pain: none  Post-op Assessment: Post-op Vital signs reviewed, Patient's Cardiovascular Status Stable, Respiratory Function Stable, Patent Airway, No signs of Nausea or vomiting, Pain level controlled, No headache, No backache, No residual numbness and No residual motor weakness  Post-op Vital Signs: Reviewed and stable  Complications: No apparent anesthesia complications

## 2012-09-01 NOTE — Op Note (Signed)
Operative report  Preop diagnosis Term pregnancy at 37+ weeks Twin gestation vertex breech lie  Postop diagnosis Same  Procedure Primary low transverse C-section with 2 layer closure of uterus  Surgeon Dr. Huel Cote Dr. Tracey Harries  Anesthesia Spinal  Fluids Estimated blood loss 800 cc Urine output 100 cc clear urine IV fluid 2200 cc LR  Findings A viable female infant twin A in the vertex presentation Apgars were 8 and 9, there is a nuchal cord x1 reduced. Weight pending at time of dictation. Twin B was actually transverse with back down and had to be rotated down to breech for delivery. Apgars were 7 and 9 of this viable female infant twin B. Weight pending at time of dictation. There were normal uterus tubes and ovaries noted. There is no clubbing or scar tissue noted at the tubes.  Specimen Placenta was sent to pathology  Procedure Patient was taken to the operating room after informed consent was obtained and spinal anesthesia obtained without difficulty. She was then  prepped and draped in the normal sterile fashion in the dorsal supine position with a leftward tilt. An appropriate time out was performed. The patient had a Foley catheter placed. A Pfannenstiel skin incision was then made and carried through to the underlying layer of fascia by sharp dissection and Bovie cautery. The fascia was then nicked in the midline and the incision extended laterally. The inferior aspect of the fashion was then grasped Coker clamps elevated and dissected off the underlying rectus muscles. In a similar fashion the superior aspect was dissected off the rectus muscles. Rectus muscles were then separated in the midline and the peritoneal cavity entered sharply. The incision was then extended both superiorly and inferiorly with careful attention to avoid both bowel bladder. The Alexis self-retaining wound retractor was then placed within the incision and the lower uterine segment exposed  nicely. The bladder flap was created with Metzenbaum scissors. The lower uterine segment was then incised in a transverse fashion. There were some large sinuses which bled at time of the uterine incision but these slowed down as the infant delivered. The bag was ruptured with clear fluid noted and the infant delivered in the vertex position without difficulty. Nose and mouth were bulb suctioned and infant was handed to the waiting pediatricians after the cord was clamped and cut. The second twin was then palpated in the bag and was found to be transverse back down the feet were grasped in the left fundus and pulled down to the level of the incision. Once this was performed the infant was delivered and gentle traction down to the level of the scapula the arms were distended upward and these were gently pulled down across the infant's chest. Finally the vertex was delivered in a flexed position. The cord was clamped and infant handed to the waiting pediatricians as well. In the cord blood was then obtained from twin A and twin B's cords and the placenta was delivered without difficulty. The uterus was cleared of all clots and debris with moist lap sponge and responded well to IV Pitocin. The uterine incision was then closed in 2 layers the first a running locked layer of 1-0 chromic and the second an imbricating layer of the same suture. The ovaries and tubes were then inspected and found to be normal the incision was carefully observed with no active bleeding noted therefore all instruments and sponges removed from the abdomen. The rectus muscles and peritoneum were reapproximated with several interrupted  mattress sutures of 2-0 Vicryl. The fascia was closed with 0 Vicryl in a running fashion. The subcutaneous tissue was reapproximated with a running suture of 3-0 plain. The skin was closed with 4-0 Vicryl on a Keith needle in a subcuticular stitch and reinforced with benzoin and Steri-Strips. The patient was then  taken to PACU in good condition with the baby's accompanying her and also doing well.

## 2012-09-01 NOTE — Transfer of Care (Signed)
Immediate Anesthesia Transfer of Care Note  Patient: Rebecca Armstrong  Procedure(s) Performed: Procedure(s) (LRB) with comments: CESAREAN SECTION (N/A) - Primay c/s-TWINS  Patient Location: PACU  Anesthesia Type: Spinal  Level of Consciousness: awake, alert  and oriented  Airway & Oxygen Therapy: Patient Spontanous Breathing  Post-op Assessment: Report given to PACU RN and Post -op Vital signs reviewed and stable  Post vital signs: Reviewed and stable  Complications: No apparent anesthesia complications

## 2012-09-01 NOTE — Addendum Note (Signed)
Addendum  created 09/01/12 1649 by Graciela Husbands, CRNA   Modules edited:Notes Section

## 2012-09-01 NOTE — Anesthesia Procedure Notes (Signed)
Spinal  Patient location during procedure: OR Start time: 09/01/2012 10:05 AM Staffing Anesthesiologist: Rosezella Kronick A. Performed by: anesthesiologist  Preanesthetic Checklist Completed: patient identified, site marked, surgical consent, pre-op evaluation, timeout performed, IV checked, risks and benefits discussed and monitors and equipment checked Spinal Block Patient position: sitting Prep: site prepped and draped and DuraPrep Patient monitoring: heart rate, cardiac monitor, continuous pulse ox and blood pressure Approach: midline Location: L3-4 Injection technique: single-shot Needle Needle type: Sprotte  Needle gauge: 24 G Needle length: 9 cm Needle insertion depth: 5 cm Assessment Sensory level: T4 Additional Notes Patient tolerated procedure well. Adequate sensory level.

## 2012-09-01 NOTE — Progress Notes (Signed)
Patient ID: Rebecca Armstrong, female   DOB: November 24, 1980, 32 y.o.   MRN: [id number] Per pt no changes in dictated H&P.  Brief exam WNL.

## 2012-09-01 NOTE — Anesthesia Preprocedure Evaluation (Signed)
Anesthesia Evaluation  Patient identified by MRN, date of birth, ID band Patient awake    Reviewed: Allergy & Precautions, H&P , Patient's Chart, lab work & pertinent test results  Airway Mallampati: II TM Distance: >3 FB Neck ROM: full    Dental No notable dental hx.    Pulmonary  breath sounds clear to auscultation  Pulmonary exam normal       Cardiovascular Exercise Tolerance: Good - dysrhythmias (rare PVC's) Rhythm:regular Rate:Normal     Neuro/Psych    GI/Hepatic   Endo/Other  Morbid obesity  Renal/GU      Musculoskeletal   Abdominal   Peds  Hematology   Anesthesia Other Findings Migraines O/w recently well  Reproductive/Obstetrics                           Anesthesia Physical Anesthesia Plan  ASA: III  Anesthesia Plan: Spinal   Post-op Pain Management:    Induction:   Airway Management Planned:   Additional Equipment:   Intra-op Plan:   Post-operative Plan:   Informed Consent: I have reviewed the patients History and Physical, chart, labs and discussed the procedure including the risks, benefits and alternatives for the proposed anesthesia with the patient or authorized representative who has indicated his/her understanding and acceptance.   Dental Advisory Given  Plan Discussed with: CRNA  Anesthesia Plan Comments: (Lab work confirmed with CRNA in room. Platelets okay. Discussed spinal anesthetic, and patient consents to the procedure:  included risk of possible headache,backache, failed block, allergic reaction, and nerve injury. This patient was asked if she had any questions or concerns before the procedure started. )        Anesthesia Quick Evaluation

## 2012-09-01 NOTE — Anesthesia Postprocedure Evaluation (Signed)
  Anesthesia Post-op Note  Patient: Rebecca Armstrong  Procedure(s) Performed: Procedure(s) (LRB) with comments: CESAREAN SECTION (N/A) - Primay c/s-TWINS  Patient Location: Mother/Baby  Anesthesia Type: Spinal  Level of Consciousness: awake, alert  and oriented  Airway and Oxygen Therapy: Patient Spontanous Breathing  Post-op Pain: mild  Post-op Assessment: Post-op Vital signs reviewed, Patient's Cardiovascular Status Stable, No headache, No backache, No residual numbness and No residual motor weakness  Post-op Vital Signs: Reviewed and stable  Complications: No apparent anesthesia complications

## 2012-09-01 NOTE — Brief Op Note (Signed)
09/01/2012  11:07 AM  PATIENT:  Rebecca Armstrong  31 y.o. female  PRE-OPERATIVE DIAGNOSIS:  vertex/breech/Twins  POST-OPERATIVE DIAGNOSIS:  Vertex/breech twins  PROCEDURE:  Procedure(s) (LRB) with comments: CESAREAN SECTION (N/A) - Primay c/s-TWINS  SURGEON:  Surgeon(s) and Role:    * Oliver Pila, MD - Primary    * Bing Plume, MD - Assisting   ANESTHESIA:   spinal  EBL:  Total I/O In: 3100 [I.V.:3100] Out: 875 [Urine:75; Blood:800]  BLOOD ADMINISTERED:none  DRAINS: Urinary Catheter (Foley)   LOCAL MEDICATIONS USED:  NONE  SPECIMEN:  Placenta  DISPOSITION OF SPECIMEN:  PATHOLOGY  COUNTS:  YES  TOURNIQUET:  * No tourniquets in log *  DICTATION: .Dragon Dictation  PLAN OF CARE: Admit to inpatient   PATIENT DISPOSITION:  PACU - hemodynamically stable.

## 2012-09-02 ENCOUNTER — Encounter (HOSPITAL_COMMUNITY): Payer: Self-pay | Admitting: Obstetrics and Gynecology

## 2012-09-02 LAB — CBC
HCT: 27.4 % — ABNORMAL LOW (ref 36.0–46.0)
MCH: 24.9 pg — ABNORMAL LOW (ref 26.0–34.0)
MCHC: 32.5 g/dL (ref 30.0–36.0)
MCV: 76.5 fL — ABNORMAL LOW (ref 78.0–100.0)
Platelets: 182 10*3/uL (ref 150–400)
RDW: 15.6 % — ABNORMAL HIGH (ref 11.5–15.5)
WBC: 8.5 10*3/uL (ref 4.0–10.5)

## 2012-09-02 NOTE — Discharge Summary (Addendum)
Obstetric Discharge Summary Reason for Admission: cesarean section Prenatal Procedures: ultrasound Intrapartum Procedures: cesarean: low cervical, transverse Postpartum Procedures: none Complications-Operative and Postpartum: none Hemoglobin  Date Value Range Status  09/02/2012 8.9* 12.0 - 15.0 g/dL Final     HCT  Date Value Range Status  09/02/2012 27.4* 36.0 - 46.0 % Final    Physical Exam:  General: alert and cooperative Lochia: appropriate Uterine Fundus: firm Incision: healing well, no significant drainage   Discharge Diagnoses: Term Pregnancy-delivered                                         Twin gestation at 37+ weeks (vertex/transverse) Discharge Information: Date: 09/04/2012 Activity: pelvic rest Diet: routine Medications: Ibuprofen and Percocet Condition: improved Instructions: refer to practice specific booklet Discharge to: home Follow-up Information    Follow up with Oliver Pila, MD. Schedule an appointment as soon as possible for a visit in 2 weeks. (incision check)    Contact information:   [address], [address] [place] [address] [phone]          Newborn Data:   Avie, Checo [[id number]]  Live born female  Birth Weight: 6 lb 0.1 oz (2725 g) APGAR: 8, 9   Marceil, Welp [[id number]]  Live born female  Birth Weight: 6 lb 8.2 oz (2955 g) APGAR: 7, 9  Home with mother.  Oliver Pila 09/04/2012, 9:19 AM

## 2012-09-02 NOTE — Progress Notes (Signed)
Subjective: Postpartum Day 1 Cesarean Delivery Patient reports tolerating PO and no problems voiding.  Working on breastfeeding  Objective: Vital signs in last 24 hours: Temp:  [97.2 F (36.2 C)-98.9 F (37.2 C)] 97.2 F (36.2 C) (10/23 0554) Pulse Rate:  [63-121] 91  (10/23 0554) Resp:  [14-20] 18  (10/23 0554) BP: (115-157)/(60-93) 124/81 mmHg (10/23 0554) SpO2:  [93 %-100 %] 98 % (10/23 0200)  Physical Exam:  General: alert and cooperative Lochia: appropriate Uterine Fundus: firm Incision: C/D/I  Basename 09/02/12 0535 09/01/12 0818  HGB 8.9* 10.6*  HCT 27.4* 32.5*    Assessment/Plan: Status post Cesarean section. Doing well postoperatively.  Continue current care.  Oliver Pila 09/02/2012, 8:42 AM

## 2012-09-03 NOTE — Progress Notes (Signed)
Subjective: Postpartum Day 2 Cesarean Delivery Patient reports tolerating PO, + flatus and no problems voiding.    Objective: Vital signs in last 24 hours: Temp:  [97.6 F (36.4 C)-98 F (36.7 C)] 97.6 F (36.4 C) (10/24 0600) Pulse Rate:  [79-99] 79  (10/24 0600) Resp:  [18-20] 20  (10/24 0600) BP: (123-137)/(77-85) 123/80 mmHg (10/24 0600) SpO2:  [96 %] 96 % (10/24 0600)  Physical Exam:  General: alert and cooperative Lochia: appropriate Uterine Fundus: firm Incision: healing well, no significant drainage    Basename 09/02/12 0535 09/01/12 0818  HGB 8.9* 10.6*  HCT 27.4* 32.5*    Assessment/Plan: Status post Cesarean section. Doing well postoperatively.  Continue current care.  Oliver Pila 09/03/2012, 8:43 AM

## 2012-09-04 MED ORDER — IBUPROFEN 600 MG PO TABS: 600.0000 mg | ORAL_TABLET | Freq: Four times a day (QID) | ORAL | Status: DC

## 2012-09-04 MED ORDER — OXYCODONE-ACETAMINOPHEN 5-325 MG PO TABS: 1.0000 | ORAL_TABLET | ORAL | Status: DC | PRN

## 2012-09-04 NOTE — Progress Notes (Signed)
Subjective: Postpartum Day 3 Cesarean Delivery Patient reports tolerating PO, + flatus and no problems voiding.    Objective: Vital signs in last 24 hours: Temp:  [97.4 F (36.3 C)-97.9 F (36.6 C)] 97.5 F (36.4 C) (10/25 0640) Pulse Rate:  [76-80] 80  (10/25 0640) Resp:  [18-20] 18  (10/25 0640) BP: (130-144)/(64-88) 130/80 mmHg (10/25 0640) SpO2:  [97 %] 97 % (10/24 1400)  Physical Exam:  General: alert and cooperative Lochia: appropriate Uterine Fundus: firm Incision: healing well, no significant erythema    Basename 09/02/12 0535  HGB 8.9*  HCT 27.4*    Assessment/Plan: Status post Cesarean section. D/c home Motrin, percocet  Rebecca Armstrong W 09/04/2012, 9:10 AM

## 2013-02-18 ENCOUNTER — Telehealth: Payer: Self-pay | Admitting: Internal Medicine

## 2013-02-18 NOTE — Telephone Encounter (Signed)
Patient Information:  Caller Name: Kailea  Phone: [phone]  Patient: Rebecca Armstrong, Rebecca Armstrong  Gender: Female  DOB: 03/15/1981  Age: 32 Years  PCP: Marga Melnick  Pregnant: No  Office Follow Up:  Does the office need to follow up with this patient?: NO     Patient instructed to call for appointment on 02/19/13 if she does not go to Urgent Care this evening.   Symptoms  Reason For Call & Symptoms: Mild fever, headache, sinus pressure, sore throat, runny nose, body aches - mild.   Emergent symptoms ruled out.  Home care and see Today in Office per Colds protocol.  Reviewed Health History In EMR: Yes  Reviewed Medications In EMR: Yes  Reviewed Allergies In EMR: Yes  Reviewed Surgeries / Procedures: Yes  Date of Onset of Symptoms: 02/17/2013  Treatments Tried: Allegra D, Tylenol  Treatments Tried Worked: Yes  Any Fever: Yes  Fever Taken: Oral  Fever Time Of Reading: 15:45:00  Fever Last Reading: 99.5 OB / GYN:  LMP: 02/02/2013  Guideline(s) Used:  Colds  Disposition Per Guideline:   See Today in Office  Reason For Disposition Reached:   Sinus pain (not just congestion) and fever  Advice Given:  For a Stuffy Nose - Use Nasal Washes:  Introduction: Saline (salt water) nasal irrigation (nasal wash) is an effective and simple home remedy for treating stuffy nose and sinus congestion. The nose can be irrigated by pouring, spraying, or squirting salt water into the nose and then letting it run back out.  Treatment for Associated Symptoms of Colds:  For muscle aches, headaches, or moderate fever (more than 101 F or 38.9 C): Take acetaminophen every 4 hours.  Sore throat: Try throat lozenges, hard candy, or warm chicken broth.  Hydrate: Drink adequate liquids.  Humidifier:  If the air in your home is dry, use a cool-mist humidifier  Contagiousness:  Cover your nose and mouth with a tissue when you sneeze or cough.  Wash your hands frequently with soap and water.  Expected Course:    Fever may last 2-3 days  Nasal discharge 7-14 days  Cough up to 2-3 weeks.  Patient Refused Recommendation:  Patient Refused Appt, Patient Requests Appt At Later Date  Requests to be seen 02/19/13

## 2013-02-19 ENCOUNTER — Ambulatory Visit (INDEPENDENT_AMBULATORY_CARE_PROVIDER_SITE_OTHER): Payer: BC Managed Care – PPO | Admitting: Family Medicine

## 2013-02-19 VITALS — BP 110/82 | HR 98 | Temp 98.1°F | Wt 168.0 lb

## 2013-02-19 DIAGNOSIS — J309 Allergic rhinitis, unspecified: Secondary | ICD-10-CM | POA: Insufficient documentation

## 2013-02-19 MED ORDER — MOMETASONE FUROATE 50 MCG/ACT NA SUSP: 2.0000 | Freq: Every day | NASAL | Status: DC

## 2013-02-19 NOTE — Patient Instructions (Addendum)
This appears to be a viral/allergy combo Continue the Allegra D daily Add the Nasonex 2 sprays each nostril Drink plenty of fluids REST! Hang in there!!!

## 2013-02-19 NOTE — Progress Notes (Signed)
  Subjective:    Patient ID: Rebecca Armstrong, female    DOB: Mar 24, 1981, 32 y.o.   MRN: [id number]  HPI URI- nose was initially stopped up but 'took allegra and now it's like a faucet'.  + sneezing, watery eyes, HA, Tm 99.5 yesterday.  Mild ear pain this AM.  + maxillary sinus pressure, 'my whole head hurts'.  No tooth pain.  + sick contacts.  + hx of seasonal allergies- typically takes Claritin.  Has taken Flonase previously and did not tolerate flower smell.   Review of Systems For ROS see HPI     Objective:   Physical Exam  Vitals reviewed. Constitutional: She appears well-developed and well-nourished. No distress.  HENT:  Head: Normocephalic and atraumatic.  Right Ear: Tympanic membrane normal.  Left Ear: Tympanic membrane normal.  Nose: Mucosal edema and rhinorrhea present. Right sinus exhibits no maxillary sinus tenderness and no frontal sinus tenderness. Left sinus exhibits no maxillary sinus tenderness and no frontal sinus tenderness.  Mouth/Throat: Mucous membranes are normal. Posterior oropharyngeal erythema (w/ PND) present.  Eyes: Conjunctivae and EOM are normal. Pupils are equal, round, and reactive to light.  Neck: Normal range of motion. Neck supple.  Cardiovascular: Normal rate, regular rhythm and normal heart sounds.   Pulmonary/Chest: Effort normal and breath sounds normal. No respiratory distress. She has no wheezes. She has no rales.  Lymphadenopathy:    She has no cervical adenopathy.          Assessment & Plan:

## 2013-02-19 NOTE — Assessment & Plan Note (Signed)
New.  Pt's sxs and PE consistent w/ allergies.  No evidence of bacterial infxn- no need for abx.  Start nasal steroid.  Reviewed supportive care and red flags that should prompt return.  Pt expressed understanding and is in agreement w/ plan.

## 2013-06-29 ENCOUNTER — Encounter: Payer: Self-pay | Admitting: Internal Medicine

## 2013-06-29 ENCOUNTER — Ambulatory Visit (INDEPENDENT_AMBULATORY_CARE_PROVIDER_SITE_OTHER): Payer: BC Managed Care – PPO | Admitting: Internal Medicine

## 2013-06-29 VITALS — BP 120/78 | HR 87 | Temp 98.1°F | Ht 64.0 in | Wt 166.0 lb

## 2013-06-29 DIAGNOSIS — G43009 Migraine without aura, not intractable, without status migrainosus: Secondary | ICD-10-CM

## 2013-06-29 DIAGNOSIS — Z5189 Encounter for other specified aftercare: Secondary | ICD-10-CM

## 2013-06-29 DIAGNOSIS — T783XXD Angioneurotic edema, subsequent encounter: Secondary | ICD-10-CM

## 2013-06-29 DIAGNOSIS — Z1331 Encounter for screening for depression: Secondary | ICD-10-CM

## 2013-06-29 DIAGNOSIS — Z0000 Encounter for general adult medical examination without abnormal findings: Secondary | ICD-10-CM

## 2013-06-29 DIAGNOSIS — J309 Allergic rhinitis, unspecified: Secondary | ICD-10-CM

## 2013-06-29 DIAGNOSIS — T783XXA Angioneurotic edema, initial encounter: Secondary | ICD-10-CM | POA: Insufficient documentation

## 2013-06-29 MED ORDER — DULOXETINE HCL 30 MG PO CPEP: 30.0000 mg | ORAL_CAPSULE | Freq: Every day | ORAL | Status: DC

## 2013-06-29 NOTE — Progress Notes (Signed)
  Subjective:    Patient ID: Rebecca Armstrong, female    DOB: September 01, 1981, 32 y.o.   MRN: [id number]   HPI  She is here for a physical;acute issues include child rearing stresses. Citalopram prescribed by her Melrose Nakayama  has been effective @ 20 mg 1/2 daily; but she has not been able to lose weight post partum.    Review of Systems  She is on a modified heart healthy diet; she exercises as walking 15-30 minutes 3 times per week without symptoms. Specifically she denies chest pain, palpitations, dyspnea, or claudication.  Family history is ? positive for premature coronary disease in a paternal aunt who died @ 10.      Objective:   Physical Exam  Gen.:  well-nourished in appearance. Alert, appropriate and cooperative throughout exam. Head: Normocephalic without obvious abnormalities  Eyes: No corneal or conjunctival inflammation noted. Pupils equal round reactive to light and accommodation. Minimal ptosis OD. Extraocular motion intact. Vision grossly normal without lenses Ears: External  ear exam reveals no significant lesions or deformities. Canals clear .TMs normal. Hearing is grossly normal bilaterally. Nose: External nasal exam reveals no deformity or inflammation. Nasal mucosa are pink and moist. No lesions or exudates noted.   Mouth: Oral mucosa and oropharynx reveal no lesions or exudates. Teeth in good repair. Neck: No deformities, masses, or tenderness noted. Range of motion & Thyroid normal. ? Cervical rib on L Lungs: Normal respiratory effort; chest expands symmetrically. Lungs are clear to auscultation without rales, wheezes, or increased work of breathing. Heart: Normal rate and rhythm. Normal S1 and S2. No gallop, click, or rub. S4 w/o murmur. Abdomen: Bowel sounds normal; abdomen soft and nontender. No masses, organomegaly or hernias noted. Genitalia: As per Gyn                                  Musculoskeletal/extremities: No deformity or scoliosis noted of  the thoracic or lumbar spine.   No clubbing, cyanosis, edema, or significant extremity  deformity noted. Range of motion normal .Tone & strength  Normal. Joints normal . Nail health good.minor crepitus knees. Able to lie down & sit up w/o help. Negative SLR bilaterally Vascular: Carotid, radial artery, dorsalis pedis and  posterior tibial pulses are full and equal. No bruits present. Neurologic: Alert and oriented x3. Deep tendon reflexes symmetrical and normal.        Skin: Intact without suspicious lesions or rashes. Lymph: No cervical, axillary lymphadenopathy present. Psych: Mood and affect are normal. 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 Possible switch to Cymbalta discussed.

## 2013-06-29 NOTE — Patient Instructions (Addendum)
Order for labs entered into  the computer; these will be performed at [address] [facility]. across from [facility]. No appointment is necessary. Cheaper therapeutically equivalent option for Cymbalta may be available from  [facility] at [phone] .  If you activate the  My Chart system; lab & Xray results will be released directly  to you as soon as I review & address these through the computer. If you choose not to sign up for My Chart within 36 hours of labs being drawn; results will be reviewed & interpretation added before being copied & mailed, causing a delay in getting the results to you.If you do not receive that report within 7-10 days ,please call. Additionally you can use this system to gain direct  access to your records  if  out of town or @ an office of a  physician who is not in  the My Chart network.  This improves continuity of care & places you in control of your medical record.

## 2013-07-05 LAB — HM PAP SMEAR: HM Pap smear: ABNORMAL

## 2013-10-29 ENCOUNTER — Other Ambulatory Visit: Payer: Self-pay | Admitting: Family Medicine

## 2013-10-30 NOTE — Telephone Encounter (Signed)
Med filled.  

## 2014-01-12 ENCOUNTER — Ambulatory Visit (INDEPENDENT_AMBULATORY_CARE_PROVIDER_SITE_OTHER): Payer: BC Managed Care – PPO | Admitting: Internal Medicine

## 2014-01-12 ENCOUNTER — Encounter: Payer: Self-pay | Admitting: Internal Medicine

## 2014-01-12 VITALS — BP 100/70 | HR 72 | Temp 98.2°F | Wt 155.0 lb

## 2014-01-12 DIAGNOSIS — J069 Acute upper respiratory infection, unspecified: Secondary | ICD-10-CM

## 2014-01-12 DIAGNOSIS — R05 Cough: Secondary | ICD-10-CM

## 2014-01-12 DIAGNOSIS — R059 Cough, unspecified: Secondary | ICD-10-CM

## 2014-01-12 MED ORDER — BENZONATATE 200 MG PO CAPS: 200.0000 mg | ORAL_CAPSULE | Freq: Three times a day (TID) | ORAL | Status: DC | PRN

## 2014-01-12 MED ORDER — MOMETASONE FUROATE 50 MCG/ACT NA SUSP: 2.0000 | Freq: Every day | NASAL | Status: DC

## 2014-01-12 NOTE — Progress Notes (Signed)
   Subjective:    Patient ID: Rebecca Armstrong, female    DOB: 04-09-1981, 33 y.o.   MRN: [id number]  HPI   Her present symptoms began 01/09/14 as sore throat; this was followed by frontal and maxillary sinus pressure without significant pain  She subsequently developed a cough which was nonproductive and paroxysmal.Mucinex products help  She describes some itchy, watery eyes, and sneezing.    Last week she had fever, sore throat, and loose watery stools. This was associated with cramps. She was prescribed Tamiflu but she only took 2 pills as symptoms  essentially resolved.   Review of Systems  She is not having nasal purulence; all secretions are clear. There's been no sputum production.  She has no fever, chills, or sweats.  She is not having pleuritic discomfort, shortness for breath, or wheezing.     Objective:   Physical Exam General appearance:good health ;well nourished; no acute distress or increased work of breathing is present.  No  lymphadenopathy about the head, neck, or axilla noted.   Eyes: No conjunctival inflammation or lid edema is present.  Ears:  External ear exam shows no significant lesions or deformities.  Otoscopic examination reveals clear canals, tympanic membranes are intact bilaterally without bulging, retraction, inflammation or discharge.  Nose:  External nasal examination shows no deformity or inflammation. Nasal mucosa are pink but dry without lesions or exudates. No septal dislocation or deviation.No obstruction to airflow.   Oral exam: Dental hygiene is good; lips and gums are healthy appearing.There is no oropharyngeal erythema or exudate noted.   Neck:  No deformities, masses, or tenderness noted.    Heart:  Normal rate and regular rhythm. S1 and S2 normal without gallop, murmur, click, rub or other extra sounds.   Lungs:Chest clear to auscultation; no wheezes, rhonchi,rales ,or rubs present.No increased work of breathing.    Extremities:  No  cyanosis, edema, or clubbing  noted    Skin: Warm & dry .         Assessment & Plan:  #1 acute URI #2 cough See orders

## 2014-01-12 NOTE — Patient Instructions (Addendum)
Plain Mucinex (NOT D) for thick secretions ;force NON dairy fluids .   Nasal cleansing in the shower as discussed with lather of mild shampoo.After 10 seconds wash off lather while  exhaling through nostrils. Make sure that all residual soap is removed to prevent irritation.   Nasonex ,Flonase OR Nasacort AQ 1 spray in each nostril twice a day as needed. Use the "crossover" technique into opposite nostril spraying toward opposite ear @ 45 degree angle, not straight up into nostril.  Plain Allegra (NOT D )  160 daily , Loratidine 10 mg , OR Zyrtec 10 mg @ bedtime  as needed for itchy eyes & sneezing. Zicam Melts or Zinc lozenges as per package label for scratchy throat . Complementary options include  vitamin C 2000 mg daily; & Echinacea for 4-7 days. Report persistent or progressive fever; discolored nasal or chest secretions; or frontal headache or facial  pain.

## 2014-01-12 NOTE — Progress Notes (Signed)
Pre visit review using our clinic review tool, if applicable. No additional management support is needed unless otherwise documented below in the visit note. 

## 2014-01-25 ENCOUNTER — Other Ambulatory Visit: Payer: Self-pay | Admitting: Internal Medicine

## 2014-01-25 ENCOUNTER — Telehealth: Payer: Self-pay

## 2014-01-25 MED ORDER — MUPIROCIN 2 % EX OINT: TOPICAL_OINTMENT | CUTANEOUS | Status: DC

## 2014-01-25 NOTE — Telephone Encounter (Signed)
The patient called and stated she believes she has a staff infection in the corners of her mouth.  She is hoping to get the rx - vytone called in  [facility] - [phone]

## 2014-01-25 NOTE — Telephone Encounter (Signed)
Please advise.//AB/CMA 

## 2014-01-26 NOTE — Telephone Encounter (Signed)
Spoke the pt and informed her of Dr. Frederik PearHopper's note below.  Pt agreed and stated that she will give it a try.//AB/CMA

## 2014-01-26 NOTE — Telephone Encounter (Signed)
I am not familiar with this medication.I personally use generic Bactroban which I called in to her pharmacy

## 2014-09-12 ENCOUNTER — Encounter: Payer: Self-pay | Admitting: Internal Medicine

## 2015-07-24 ENCOUNTER — Telehealth: Payer: Self-pay | Admitting: Internal Medicine

## 2015-07-24 NOTE — Telephone Encounter (Signed)
Patient called requesting medication for sinus infection. She is requesting clindamycin. Pt uses [facility]. CB# [phone] Pt was advised that Dr. Alwyn Ren is out of the office today and that she may need to come in for an appt.

## 2015-07-24 NOTE — Telephone Encounter (Signed)
Spoke with pt to inform that an office visit was needed due to the fact that she had not been seen since 3/15. She stated that she was trying to avoid having to come into the office. Advised her she could [facility] if she could not wait to come in for appt. She stated that she had a bottle of Amoxicillin she was going to take from previous sickness. Advised her to check the expiration date before doing so.

## 2016-02-23 DIAGNOSIS — Z319 Encounter for procreative management, unspecified: Secondary | ICD-10-CM | POA: Diagnosis not present

## 2016-04-10 DIAGNOSIS — G43909 Migraine, unspecified, not intractable, without status migrainosus: Secondary | ICD-10-CM | POA: Diagnosis not present

## 2016-04-10 DIAGNOSIS — F325 Major depressive disorder, single episode, in full remission: Secondary | ICD-10-CM | POA: Diagnosis not present

## 2016-04-10 DIAGNOSIS — Z808 Family history of malignant neoplasm of other organs or systems: Secondary | ICD-10-CM | POA: Diagnosis not present

## 2016-04-10 DIAGNOSIS — Z6832 Body mass index (BMI) 32.0-32.9, adult: Secondary | ICD-10-CM | POA: Diagnosis not present

## 2016-04-10 DIAGNOSIS — Z1389 Encounter for screening for other disorder: Secondary | ICD-10-CM | POA: Diagnosis not present

## 2016-05-06 DIAGNOSIS — D235 Other benign neoplasm of skin of trunk: Secondary | ICD-10-CM | POA: Diagnosis not present

## 2016-05-06 DIAGNOSIS — L821 Other seborrheic keratosis: Secondary | ICD-10-CM | POA: Diagnosis not present

## 2016-05-06 DIAGNOSIS — L720 Epidermal cyst: Secondary | ICD-10-CM | POA: Diagnosis not present

## 2016-05-06 DIAGNOSIS — L814 Other melanin hyperpigmentation: Secondary | ICD-10-CM | POA: Diagnosis not present

## 2016-05-08 DIAGNOSIS — Z0000 Encounter for general adult medical examination without abnormal findings: Secondary | ICD-10-CM | POA: Diagnosis not present

## 2016-05-16 DIAGNOSIS — Z6832 Body mass index (BMI) 32.0-32.9, adult: Secondary | ICD-10-CM | POA: Diagnosis not present

## 2016-05-16 DIAGNOSIS — M25511 Pain in right shoulder: Secondary | ICD-10-CM | POA: Diagnosis not present

## 2016-05-16 DIAGNOSIS — F325 Major depressive disorder, single episode, in full remission: Secondary | ICD-10-CM | POA: Diagnosis not present

## 2016-05-16 DIAGNOSIS — Z1389 Encounter for screening for other disorder: Secondary | ICD-10-CM | POA: Diagnosis not present

## 2016-05-16 DIAGNOSIS — Z0000 Encounter for general adult medical examination without abnormal findings: Secondary | ICD-10-CM | POA: Diagnosis not present

## 2016-06-06 DIAGNOSIS — Z3046 Encounter for surveillance of implantable subdermal contraceptive: Secondary | ICD-10-CM | POA: Diagnosis not present

## 2016-12-10 DIAGNOSIS — Z23 Encounter for immunization: Secondary | ICD-10-CM | POA: Diagnosis not present

## 2016-12-24 DIAGNOSIS — N911 Secondary amenorrhea: Secondary | ICD-10-CM | POA: Diagnosis not present

## 2016-12-26 DIAGNOSIS — Z3A01 Less than 8 weeks gestation of pregnancy: Secondary | ICD-10-CM | POA: Diagnosis not present

## 2016-12-26 DIAGNOSIS — O09521 Supervision of elderly multigravida, first trimester: Secondary | ICD-10-CM | POA: Diagnosis not present

## 2017-01-23 DIAGNOSIS — Z3A09 9 weeks gestation of pregnancy: Secondary | ICD-10-CM | POA: Diagnosis not present

## 2017-01-23 DIAGNOSIS — O34211 Maternal care for low transverse scar from previous cesarean delivery: Secondary | ICD-10-CM | POA: Diagnosis not present

## 2017-01-23 DIAGNOSIS — O0901 Supervision of pregnancy with history of infertility, first trimester: Secondary | ICD-10-CM | POA: Diagnosis not present

## 2017-01-23 DIAGNOSIS — O09521 Supervision of elderly multigravida, first trimester: Secondary | ICD-10-CM | POA: Diagnosis not present

## 2017-01-23 DIAGNOSIS — O26891 Other specified pregnancy related conditions, first trimester: Secondary | ICD-10-CM | POA: Diagnosis not present

## 2017-01-23 DIAGNOSIS — Z3689 Encounter for other specified antenatal screening: Secondary | ICD-10-CM | POA: Diagnosis not present

## 2017-01-23 LAB — OB RESULTS CONSOLE ANTIBODY SCREEN: Antibody Screen: NEGATIVE

## 2017-01-23 LAB — OB RESULTS CONSOLE ABO/RH: RH TYPE: POSITIVE

## 2017-01-23 LAB — OB RESULTS CONSOLE HIV ANTIBODY (ROUTINE TESTING): HIV: NONREACTIVE

## 2017-01-23 LAB — OB RESULTS CONSOLE GC/CHLAMYDIA
Chlamydia: NEGATIVE
Gonorrhea: NEGATIVE

## 2017-01-23 LAB — OB RESULTS CONSOLE RUBELLA ANTIBODY, IGM: RUBELLA: IMMUNE

## 2017-01-23 LAB — OB RESULTS CONSOLE HEPATITIS B SURFACE ANTIGEN: HEP B S AG: NEGATIVE

## 2017-01-23 LAB — OB RESULTS CONSOLE RPR: RPR: NONREACTIVE

## 2017-03-28 DIAGNOSIS — Z3A18 18 weeks gestation of pregnancy: Secondary | ICD-10-CM | POA: Diagnosis not present

## 2017-03-28 DIAGNOSIS — Z363 Encounter for antenatal screening for malformations: Secondary | ICD-10-CM | POA: Diagnosis not present

## 2017-04-02 DIAGNOSIS — N898 Other specified noninflammatory disorders of vagina: Secondary | ICD-10-CM | POA: Diagnosis not present

## 2017-05-19 DIAGNOSIS — Z3482 Encounter for supervision of other normal pregnancy, second trimester: Secondary | ICD-10-CM | POA: Diagnosis not present

## 2017-06-04 DIAGNOSIS — Z3689 Encounter for other specified antenatal screening: Secondary | ICD-10-CM | POA: Diagnosis not present

## 2017-06-04 DIAGNOSIS — Z3A28 28 weeks gestation of pregnancy: Secondary | ICD-10-CM | POA: Diagnosis not present

## 2017-06-04 DIAGNOSIS — Z23 Encounter for immunization: Secondary | ICD-10-CM | POA: Diagnosis not present

## 2017-07-02 DIAGNOSIS — O368130 Decreased fetal movements, third trimester, not applicable or unspecified: Secondary | ICD-10-CM | POA: Diagnosis not present

## 2017-07-02 DIAGNOSIS — Z3A32 32 weeks gestation of pregnancy: Secondary | ICD-10-CM | POA: Diagnosis not present

## 2017-07-16 DIAGNOSIS — O34211 Maternal care for low transverse scar from previous cesarean delivery: Secondary | ICD-10-CM | POA: Diagnosis not present

## 2017-07-16 DIAGNOSIS — Z3A34 34 weeks gestation of pregnancy: Secondary | ICD-10-CM | POA: Diagnosis not present

## 2017-07-16 DIAGNOSIS — O368190 Decreased fetal movements, unspecified trimester, not applicable or unspecified: Secondary | ICD-10-CM | POA: Diagnosis not present

## 2017-07-16 DIAGNOSIS — O09529 Supervision of elderly multigravida, unspecified trimester: Secondary | ICD-10-CM | POA: Diagnosis not present

## 2017-07-23 DIAGNOSIS — Z3685 Encounter for antenatal screening for Streptococcus B: Secondary | ICD-10-CM | POA: Diagnosis not present

## 2017-07-24 LAB — OB RESULTS CONSOLE GBS: STREP GROUP B AG: NEGATIVE

## 2017-08-04 DIAGNOSIS — Z23 Encounter for immunization: Secondary | ICD-10-CM | POA: Diagnosis not present

## 2017-08-11 ENCOUNTER — Encounter (HOSPITAL_COMMUNITY): Payer: Self-pay | Admitting: *Deleted

## 2017-08-15 NOTE — Patient Instructions (Signed)
     Laketra Bowdish  08/15/2017   Your procedure is scheduled on:  08/19/2017  Enter through the Main Entrance of [facility] at 0530 AM.  Pick up the phone at the desk and dial [phone]  Call this number if you have problems the morning of surgery:[phone]  Remember:   Do not eat food:After Midnight.  Do not drink clear liquids: After Midnight.  Take these medicines the morning of surgery with A SIP OF WATER: none   Do not wear jewelry, make-up or nail polish.  Do not wear lotions, powders, or perfumes. Do not wear deodorant.  Do not shave 48 hours prior to surgery.  Do not bring valuables to the hospital.  [facility] is not   responsible for any belongings or valuables brought to the hospital.  Contacts, dentures or bridgework may not be worn into surgery.  Leave suitcase in the car. After surgery it may be brought to your room.  For patients admitted to the hospital, checkout time is 11:00 AM the day of              discharge.    N/A   Please read over the following fact sheets that you were given:   Surgical Site Infection Prevention

## 2017-08-18 ENCOUNTER — Encounter (HOSPITAL_COMMUNITY): Payer: Self-pay

## 2017-08-18 ENCOUNTER — Inpatient Hospital Stay (HOSPITAL_COMMUNITY)
Admission: AD | Admit: 2017-08-18 | Discharge: 2017-08-21 | DRG: 784 | Disposition: A | Discharge status: Home / Self Care | Payer: BLUE CROSS/BLUE SHIELD | Source: Ambulatory Visit | Attending: Obstetrics and Gynecology | Admitting: Obstetrics and Gynecology

## 2017-08-18 ENCOUNTER — Inpatient Hospital Stay (HOSPITAL_COMMUNITY): Payer: BLUE CROSS/BLUE SHIELD

## 2017-08-18 ENCOUNTER — Encounter (HOSPITAL_COMMUNITY)
Admission: RE | Admit: 2017-08-18 | Discharge: 2017-08-18 | Disposition: A | Discharge status: Home / Self Care | Payer: BLUE CROSS/BLUE SHIELD | Source: Ambulatory Visit | Attending: Obstetrics and Gynecology | Admitting: Obstetrics and Gynecology

## 2017-08-18 DIAGNOSIS — Z302 Encounter for sterilization: Secondary | ICD-10-CM | POA: Diagnosis not present

## 2017-08-18 DIAGNOSIS — R319 Hematuria, unspecified: Secondary | ICD-10-CM | POA: Diagnosis not present

## 2017-08-18 DIAGNOSIS — O99214 Obesity complicating childbirth: Secondary | ICD-10-CM | POA: Diagnosis present

## 2017-08-18 DIAGNOSIS — R102 Pelvic and perineal pain: Secondary | ICD-10-CM | POA: Diagnosis not present

## 2017-08-18 DIAGNOSIS — Z87442 Personal history of urinary calculi: Secondary | ICD-10-CM | POA: Diagnosis not present

## 2017-08-18 DIAGNOSIS — N1330 Unspecified hydronephrosis: Secondary | ICD-10-CM | POA: Diagnosis not present

## 2017-08-18 DIAGNOSIS — O2390 Unspecified genitourinary tract infection in pregnancy, unspecified trimester: Secondary | ICD-10-CM | POA: Diagnosis not present

## 2017-08-18 DIAGNOSIS — O34211 Maternal care for low transverse scar from previous cesarean delivery: Secondary | ICD-10-CM | POA: Diagnosis present

## 2017-08-18 DIAGNOSIS — Z23 Encounter for immunization: Secondary | ICD-10-CM | POA: Diagnosis not present

## 2017-08-18 DIAGNOSIS — Z6836 Body mass index (BMI) 36.0-36.9, adult: Secondary | ICD-10-CM | POA: Diagnosis not present

## 2017-08-18 DIAGNOSIS — Z98891 History of uterine scar from previous surgery: Secondary | ICD-10-CM | POA: Diagnosis not present

## 2017-08-18 DIAGNOSIS — O26833 Pregnancy related renal disease, third trimester: Principal | ICD-10-CM | POA: Diagnosis present

## 2017-08-18 DIAGNOSIS — N132 Hydronephrosis with renal and ureteral calculous obstruction: Secondary | ICD-10-CM | POA: Diagnosis not present

## 2017-08-18 DIAGNOSIS — F329 Major depressive disorder, single episode, unspecified: Secondary | ICD-10-CM | POA: Diagnosis not present

## 2017-08-18 DIAGNOSIS — O99344 Other mental disorders complicating childbirth: Secondary | ICD-10-CM | POA: Diagnosis present

## 2017-08-18 DIAGNOSIS — R8271 Bacteriuria: Secondary | ICD-10-CM | POA: Diagnosis not present

## 2017-08-18 DIAGNOSIS — Z3A39 39 weeks gestation of pregnancy: Secondary | ICD-10-CM

## 2017-08-18 DIAGNOSIS — R109 Unspecified abdominal pain: Secondary | ICD-10-CM

## 2017-08-18 DIAGNOSIS — N200 Calculus of kidney: Secondary | ICD-10-CM

## 2017-08-18 DIAGNOSIS — N201 Calculus of ureter: Secondary | ICD-10-CM | POA: Diagnosis not present

## 2017-08-18 DIAGNOSIS — N202 Calculus of kidney with calculus of ureter: Secondary | ICD-10-CM | POA: Diagnosis not present

## 2017-08-18 DIAGNOSIS — K8020 Calculus of gallbladder without cholecystitis without obstruction: Secondary | ICD-10-CM | POA: Diagnosis not present

## 2017-08-18 HISTORY — DX: Depression, unspecified: F32.A

## 2017-08-18 HISTORY — DX: Major depressive disorder, single episode, unspecified: F32.9

## 2017-08-18 LAB — CBC
HEMATOCRIT: 36.7 % (ref 36.0–46.0)
HEMOGLOBIN: 12.6 g/dL (ref 12.0–15.0)
MCH: 26.9 pg (ref 26.0–34.0)
MCHC: 34.3 g/dL (ref 30.0–36.0)
MCV: 78.3 fL (ref 78.0–100.0)
Platelets: 248 10*3/uL (ref 150–400)
RBC: 4.69 MIL/uL (ref 3.87–5.11)
RDW: 15.6 % — AB (ref 11.5–15.5)
WBC: 6.7 10*3/uL (ref 4.0–10.5)

## 2017-08-18 LAB — COMPREHENSIVE METABOLIC PANEL
ALBUMIN: 3 g/dL — AB (ref 3.5–5.0)
ALT: 17 U/L (ref 14–54)
ANION GAP: 11 (ref 5–15)
AST: 24 U/L (ref 15–41)
Alkaline Phosphatase: 196 U/L — ABNORMAL HIGH (ref 38–126)
BILIRUBIN TOTAL: 0.6 mg/dL (ref 0.3–1.2)
BUN: 12 mg/dL (ref 6–20)
CHLORIDE: 105 mmol/L (ref 101–111)
CO2: 19 mmol/L — ABNORMAL LOW (ref 22–32)
Calcium: 9 mg/dL (ref 8.9–10.3)
Creatinine, Ser: 0.53 mg/dL (ref 0.44–1.00)
GFR calc Af Amer: >60 mL/min (ref >60)
Glucose, Bld: 124 mg/dL — ABNORMAL HIGH (ref 65–99)
POTASSIUM: 3.9 mmol/L (ref 3.5–5.1)
Sodium: 135 mmol/L (ref 135–145)
TOTAL PROTEIN: 6.4 g/dL — AB (ref 6.5–8.1)

## 2017-08-18 LAB — URINALYSIS, ROUTINE W REFLEX MICROSCOPIC
BILIRUBIN URINE: NEGATIVE
GLUCOSE, UA: NEGATIVE mg/dL
KETONES UR: 20 mg/dL — AB
Nitrite: NEGATIVE
PH: 5 (ref 5.0–8.0)
PROTEIN: 30 mg/dL — AB
Specific Gravity, Urine: 1.025 (ref 1.005–1.030)

## 2017-08-18 LAB — PROTEIN / CREATININE RATIO, URINE
CREATININE, URINE: 162 mg/dL
PROTEIN CREATININE RATIO: 0.17 mg/mg{creat} — AB (ref 0.00–0.15)
TOTAL PROTEIN, URINE: 27 mg/dL

## 2017-08-18 LAB — TYPE AND SCREEN
ABO/RH(D): O POS
ANTIBODY SCREEN: NEGATIVE

## 2017-08-18 MED ORDER — HYDROMORPHONE HCL 1 MG/ML IJ SOLN
1.0000 mg | INTRAMUSCULAR | Status: DC | PRN
  Administered 2017-08-18 – 2017-08-19 (×4): 1 mg via INTRAVENOUS
  Filled 2017-08-18 (×4): qty 1

## 2017-08-18 MED ORDER — HYDROMORPHONE HCL 1 MG/ML IJ SOLN
1.0000 mg | Freq: Once | INTRAMUSCULAR | Status: AC
  Administered 2017-08-18: 1 mg via INTRAVENOUS
  Filled 2017-08-18: qty 1

## 2017-08-18 MED ORDER — ZOLPIDEM TARTRATE 5 MG PO TABS: 5.0000 mg | ORAL_TABLET | Freq: Every evening | ORAL | Status: DC | PRN

## 2017-08-18 MED ORDER — LACTATED RINGERS IV SOLN
INTRAVENOUS | Status: DC
  Administered 2017-08-18 – 2017-08-19 (×4): via INTRAVENOUS

## 2017-08-18 MED ORDER — ONDANSETRON HCL 4 MG/2ML IJ SOLN
4.0000 mg | Freq: Four times a day (QID) | INTRAMUSCULAR | Status: DC | PRN
  Administered 2017-08-19 (×3): 4 mg via INTRAVENOUS
  Filled 2017-08-18 (×2): qty 2

## 2017-08-18 MED ORDER — CALCIUM CARBONATE ANTACID 500 MG PO CHEW: 2.0000 | CHEWABLE_TABLET | ORAL | Status: DC | PRN

## 2017-08-18 MED ORDER — ACETAMINOPHEN 325 MG PO TABS: 650.0000 mg | ORAL_TABLET | ORAL | Status: DC | PRN

## 2017-08-18 MED ORDER — PRENATAL MULTIVITAMIN CH: 1.0000 | ORAL_TABLET | Freq: Every day | ORAL | Status: DC

## 2017-08-18 MED ORDER — ONDANSETRON HCL 4 MG/2ML IJ SOLN
4.0000 mg | Freq: Once | INTRAMUSCULAR | Status: AC
  Administered 2017-08-18: 4 mg via INTRAVENOUS
  Filled 2017-08-18: qty 2

## 2017-08-18 MED ORDER — DOCUSATE SODIUM 100 MG PO CAPS
100.0000 mg | ORAL_CAPSULE | Freq: Every day | ORAL | Status: DC
  Filled 2017-08-18: qty 1

## 2017-08-18 NOTE — Anesthesia Preprocedure Evaluation (Signed)
Anesthesia Evaluation  Patient identified by MRN, date of birth, ID band Patient awake    Reviewed: Allergy & Precautions, H&P , NPO status , Patient's Chart, lab work & pertinent test results  Airway Mallampati: II  TM Distance: >3 FB Neck ROM: full    Dental  (+) Dental Advisory Given   Pulmonary neg pulmonary ROS,    breath sounds clear to auscultation       Cardiovascular Exercise Tolerance: Good negative cardio ROS  (-) dysrhythmias (rare PVC's)  Rhythm:Regular Rate:Normal     Neuro/Psych  Headaches, PSYCHIATRIC DISORDERS Depression    GI/Hepatic negative GI ROS, Neg liver ROS,   Endo/Other  Morbid obesity  Renal/GU negative Renal ROS  negative genitourinary   Musculoskeletal negative musculoskeletal ROS (+)   Abdominal   Peds  Hematology negative hematology ROS (+)   Anesthesia Other Findings   Reproductive/Obstetrics (+) Pregnancy                             Anesthesia Physical  Anesthesia Plan  ASA: III  Anesthesia Plan: Spinal   Post-op Pain Management:    Induction:   PONV Risk Score and Plan: Treatment may vary due to age or medical condition  Airway Management Planned: Natural Airway  Additional Equipment: None  Intra-op Plan:   Post-operative Plan:   Informed Consent: I have reviewed the patients History and Physical, chart, labs and discussed the procedure including the risks, benefits and alternatives for the proposed anesthesia with the patient or authorized representative who has indicated his/her understanding and acceptance.   Dental Advisory Given  Plan Discussed with: CRNA  Anesthesia Plan Comments: (Discussed spinal anesthetic, and patient consents to the procedure:  included risk of possible headache, backache, failed block, allergic reaction, and nerve injury. This patient was asked if she had any questions or concerns before the procedure  started. )        Anesthesia Quick Evaluation

## 2017-08-18 NOTE — H&P (Signed)
Rebecca Armstrong is a 36 y.o. female G2P2002 at 15 1/[redacted]wks gestation admitted for management of presumptive nephrolithiasis. Pt was seen in office earlier today for low back and pelvic pain; hematuria noted; cervix 1cm dil. Pt was started on macrobid however pain worsened at home thus came to MAU. Renal US showed left hydronephrosis with hypoechoic lesions suspicious for stones. BP mildly elevated hence preE labs drawn and all wnl. Pt responded to one dose of dilaudid and BP normalized.  Her prenatal care has been benign. Her dating was confirmed with a 9 week [place]. She declined all genetic screening. HX depression but stable on celexa during pregnancy. Pt requested repeat cesarean delivery ( first for twins) and bilateral tubal ligation which is scheduled for 730am tomorrow   OB History    Gravida Para Term Preterm AB Living   _0 SAB TAB Ectopic Multiple Live Births         1 2     Past Medical History:  Diagnosis Date  . Depression    celexa 70m, restarted after twins were born and stayed on it.  .Marland KitchenDysrhythmia    history of palpitations-EKG and workup in 04/2011--NSR with PAC's-avoid stimulants  . Migraine   . Newborn product of IVF pregnancy    Past Surgical History:  Procedure Laterality Date  . CESAREAN SECTION  09/01/2012   Procedure: CESAREAN SECTION;  Surgeon: KLogan Bores MD;  [facility];  Service: Obstetrics;  Laterality: N/A;  Primay c/s-TWINS  . egg retrieval  2013   IVF-Propofol   Family History: family history includes Alcohol abuse in her mother; Breast cancer in her paternal grandmother; Depression in her paternal grandmother; Fibromyalgia in her mother; Heart attack (age of onset: 576 in her paternal grandfather; Hypertension in her maternal grandmother, mother, and unknown relative; Melanoma in her father; Miscarriages / Stillbirths in her mother; Multiple myeloma in her maternal grandfather; Sudden death (age of onset: 42 in her paternal aunt;  Transient ischemic attack in her unknown relative. Social History:  reports that she has never smoked. She has never used smokeless tobacco. She reports that she drinks alcohol. She reports that she does not use drugs.     Maternal Diabetes: No Genetic Screening: Declined Maternal Ultrasounds/Referrals: Normal: renal [place]- ? nephrolithiasis Fetal Ultrasounds or other Referrals:  None Maternal Substance Abuse:  No Significant Maternal Medications:  Meds include: Other: celexa Significant Maternal Lab Results:  Lab values include: Group B Strep negative Other Comments:  None  Review of Systems  Constitutional: Positive for malaise/fatigue. Negative for chills, fever and weight loss.  Eyes: Negative for blurred vision.  Respiratory: Negative for shortness of breath.   Cardiovascular: Negative for chest pain.  Gastrointestinal: Positive for abdominal pain. Negative for constipation, diarrhea, heartburn, nausea and vomiting.  Genitourinary: Positive for dysuria and frequency.  Musculoskeletal: Positive for back pain and myalgias.  Skin: Negative for itching and rash.  Neurological: Negative for dizziness, tingling and headaches.  Endo/Heme/Allergies: Does not bruise/bleed easily.  Psychiatric/Behavioral: Negative for depression, hallucinations, substance abuse and suicidal ideas. The patient is not nervous/anxious.    Maternal Medical History:  Reason for admission: Nausea. Nephrolithiasis at term  Contractions: Frequency: irregular.   Perceived severity is mild.    Fetal activity: Perceived fetal activity is normal.   Last perceived fetal movement was within the past hour.    Prenatal complications: no prenatal complications Prenatal Complications - Diabetes: none.  Blood pressure 121/87, pulse 100, temperature 98.2 F (36.8 C), resp. rate 18. Maternal Exam:  Uterine Assessment: Contraction strength is mild.  Contraction frequency is irregular.   Abdomen: Patient  reports generalized tenderness.  Estimated fetal weight is AGA.   Fetal presentation: vertex  Introitus: Normal vulva. Vulva is negative for condylomata, edema and lesion.  Normal vagina.  Vagina is negative for condylomata.  Cervix: Cervix evaluated by digital exam.     Physical Exam  Constitutional: She is oriented to person, place, and time. She appears well-developed and well-nourished.  Neck: Normal range of motion.  Cardiovascular: Normal rate.   Respiratory: Effort normal.  GI: Soft. There is generalized tenderness.  Genitourinary: Vagina normal and uterus normal. Vulva exhibits no lesion.  Musculoskeletal: Normal range of motion.  Neurological: She is oriented to person, place, and time.  Skin: Skin is warm and dry.  Psychiatric: She has a normal mood and affect. Her behavior is normal. Judgment and thought content normal.    Prenatal labs: ABO, Rh: --/--/O POS (10/08 0945) Antibody: NEG (10/08 0945) Rubella: _0 @ RPR: Nonreactive (03/15 0000)  HBsAg: Negative (03/15 0000)  HIV: Non-reactive (03/15 0000)  GBS: Negative (09/13 0000)   Assessment/Plan: [id number] at 39 1/7wks with nephrolithiasis; hx prior c/s for repeat cesarean section and bilateral tubal ligation CAT 1 strip Pain control with dilaudid IVF  Strain urine NPO after midnight  To OR when ready in am   Jannah Guardiola W Aili Casillas 08/18/2017, 8:04 PM

## 2017-08-18 NOTE — MAU Note (Signed)
Pt had pre-op labs drawn this morning.

## 2017-08-18 NOTE — MAU Provider Note (Signed)
Chief Complaint:  Abdominal Pain and Back Pain   First Provider Initiated Contact with Patient 08/18/17 1647      HPI: Rebecca Armstrong is a 36 y.o. G2P1002 at 29w1dwith repeat C/S scheduled tomorrow am who presents to maternity admissions reporting pain in her left lower back and left side that radiates to her left lower abdomen starting this morning. She was seen in the office and was prescribed an antibiotic for UTI today. She vomiting within 30 minutes of taking the antibiotic so does not think it stayed down. Her pain is worsening, coming in waves of 9/10 pain, and associated with nausea. There are no other associated symptoms. She took Tylenol 3.5 hours ago and it did reduce her pain but did not resolve it. She reports good fetal movement, denies regular contractions, LOF, vaginal bleeding, vaginal itching/burning, urinary symptoms, h/a, dizziness, n/v, or fever/chills.    HPI  Past Medical History: Past Medical History:  Diagnosis Date  . Depression    celexa 278m restarted after twins were born and stayed on it.  . Marland Kitchenysrhythmia    history of palpitations-EKG and workup in 04/2011--NSR with PAC's-avoid stimulants  . Migraine   . Newborn product of IVF pregnancy     Past obstetric history: OB History  Gravida Para Term Preterm AB Living  _0 SAB TAB Ectopic Multiple Live Births        1 2    # Outcome Date GA Lbr Len/2nd Weight Sex Delivery Anes PTL Lv  2 Current           1A Term 09/01/12 3764w1d lb 0.1 oz (2.725 kg) M CS-LTranv Spinal  LIV  1B Term 09/01/12 37w64w1dlb 8.2 oz (2.955 kg) F CS-LTranv Spinal  LIV      Past Surgical History: Past Surgical History:  Procedure Laterality Date  . CESAREAN SECTION  09/01/2012   Procedure: CESAREAN SECTION;  Surgeon: KathLogan Bores;  [facility];  Service: Obstetrics;  Laterality: N/A;  Primay c/s-TWINS  . egg retrieval  2013   IVF-Propofol    Family History: Family History  Problem Relation Age of  Onset  . Alcohol abuse Mother   . Miscarriages / Stil[place]   . Fibromyalgia Mother   . Hypertension Mother   . Melanoma Father   . Hypertension Maternal Grandmother   . Multiple myeloma Maternal Grandfather   . Breast cancer Paternal Grandmother   . Depression Paternal Grandmother   . Heart attack Paternal Grandfather 59  54Transient ischemic attack Unknown        MG aunt  . Sudden death Paternal AuntAunt 36Hypertension Unknown        MGGM    Social History: Social History  Substance Use Topics  . Smoking status: Never Smoker  . Smokeless tobacco: Never Used  . Alcohol use Yes     Comment:  rarely ; wine trigger for migraine    Allergies:  Allergies  Allergen Reactions  . Codeine Nausea And Vomiting    Meds:  Prescriptions Prior to Admission  Medication Sig Dispense Refill Last Dose  . acetaminophen (TYLENOL) 500 MG tablet Take 1,000 mg by mouth every 6 (six) hours as needed for mild pain.     . B Complex-C (SUPER B COMPLEX PO) Take 1 tablet by mouth daily.     . benzonatate (TESSALON) 200 MG capsule Take 1 capsule (200 mg total) by  mouth 3 (three) times daily as needed for cough. (Patient not taking: Reported on 08/12/2017) 15 capsule 0 Not Taking at Unknown time  . calcium carbonate (TUMS - DOSED IN MG ELEMENTAL CALCIUM) 500 MG chewable tablet Chew 2 tablets by mouth 2 (two) times daily as needed for indigestion or heartburn.     . citalopram (CELEXA) 20 MG tablet Take 20 mg by mouth daily.    08/11/2017 at Unknown time  . etonogestrel (NEXPLANON) 68 MG IMPL implant Inject 1 each into the skin once.   Taking  . ferrous sulfate 325 (65 FE) MG EC tablet Take 325 mg by mouth daily with breakfast.     . mometasone (NASONEX) 50 MCG/ACT nasal spray Place 2 sprays into the nose daily. (Patient taking differently: Place 1 spray into the nose daily as needed (allergies). ) 17 g 5   . mupirocin ointment (BACTROBAN) 2 % Applied twice a day to the affected area;NOT into eyes.  (Patient not taking: Reported on 08/12/2017) 15 g 0 Not Taking at Unknown time  . Prenatal Vit-Fe Fumarate-FA (PRENATAL MULTIVITAMIN) TABS tablet Take 1 tablet by mouth daily at 12 noon.       ROS:  Review of Systems  Constitutional: Negative for chills, fatigue and fever.  Eyes: Negative for visual disturbance.  Respiratory: Negative for shortness of breath.   Cardiovascular: Negative for chest pain.  Gastrointestinal: Positive for abdominal pain, nausea and vomiting.  Genitourinary: Positive for flank pain. Negative for difficulty urinating, dysuria, pelvic pain, vaginal bleeding, vaginal discharge and vaginal pain.  Musculoskeletal: Positive for back pain.  Neurological: Negative for dizziness and headaches.  Psychiatric/Behavioral: Negative.      I have reviewed patient's Past Medical Hx, Surgical Hx, Family Hx, Social Hx, medications and allergies.   Physical Exam   Patient Vitals for the past 24 hrs:  BP Temp Pulse Resp  08/18/17 1900 121/87 - 100 -  08/18/17 1846 112/76 - (!) 102 -  08/18/17 1830 128/86 - 91 -  08/18/17 1816 126/86 - (!) 104 -  08/18/17 1802 127/87 - (!) 113 -  08/18/17 1715 (!) 142/91 - (!) 103 -  08/18/17 1700 (!) 139/93 - (!) 101 -  08/18/17 1646 (!) 135/95 - (!) 103 -  08/18/17 1634 (!) 140/92 - (!) 118 -  08/18/17 1629 (!) 150/90 - (!) 115 -  08/18/17 1627 - 98.2 F (36.8 C) - 18   Constitutional: Well-developed, well-nourished female in no acute distress.  Cardiovascular: normal rate Respiratory: normal effort GI: Abd soft, non-tender, gravid appropriate for gestational age.  MS: Extremities nontender, no edema, normal ROM Neurologic: Alert and oriented x 4.  GU: Neg CVAT.  PELVIC EXAM: Cervix pink, visually closed, without lesion, scant white creamy discharge, vaginal walls and external genitalia normal Bimanual exam: Cervix 0/long/high, firm, anterior, neg CMT, uterus nontender, nonenlarged, adnexa without tenderness, enlargement, or  mass     FHT:  Baseline 135 , moderate variability, accelerations present, no decelerations Contractions: q 3-10 mins, irregular, mild to palpation   Labs: Results for orders placed or performed during the hospital encounter of 08/18/17 (from the past 24 hour(s))  Urinalysis, Routine w reflex microscopic     Status: Abnormal   Collection Time: 08/18/17  4:00 PM  Result Value Ref Range   Color, Urine YELLOW YELLOW   APPearance HAZY (A) CLEAR   Specific Gravity, Urine 1.025 1.005 - 1.030   pH 5.0 5.0 - 8.0   Glucose, UA NEGATIVE NEGATIVE mg/dL  Hgb urine dipstick LARGE (A) NEGATIVE   Bilirubin Urine NEGATIVE NEGATIVE   Ketones, ur 20 (A) NEGATIVE mg/dL   Protein, ur 30 (A) NEGATIVE mg/dL   Nitrite NEGATIVE NEGATIVE   Leukocytes, UA TRACE (A) NEGATIVE   RBC / HPF TOO NUMEROUS TO COUNT 0 - 5 RBC/hpf   WBC, UA 6-30 0 - 5 WBC/hpf   Bacteria, UA MANY (A) NONE SEEN   Squamous Epithelial / LPF 0-5 (A) NONE SEEN   Mucus PRESENT   Protein / creatinine ratio, urine     Status: Abnormal   Collection Time: 08/18/17  4:00 PM  Result Value Ref Range   Creatinine, Urine 162.00 mg/dL   Total Protein, Urine 27 mg/dL   Protein Creatinine Ratio 0.17 (H) 0.00 - 0.15 mg/mg[Cre]  Comprehensive metabolic panel     Status: Abnormal   Collection Time: 08/18/17  5:06 PM  Result Value Ref Range   Sodium 135 135 - 145 mmol/L   Potassium 3.9 3.5 - 5.1 mmol/L   Chloride 105 101 - 111 mmol/L   CO2 19 (L) 22 - 32 mmol/L   Glucose, Bld 124 (H) 65 - 99 mg/dL   BUN 12 6 - 20 mg/dL   Creatinine, Ser 0.53 0.44 - 1.00 mg/dL   Calcium 9.0 8.9 - 10.3 mg/dL   Total Protein 6.4 (L) 6.5 - 8.1 g/dL   Albumin 3.0 (L) 3.5 - 5.0 g/dL   AST 24 15 - 41 U/L   ALT 17 14 - 54 U/L   Alkaline Phosphatase 196 (H) 38 - 126 U/L   Total Bilirubin 0.6 0.3 - 1.2 mg/dL   GFR calc non Af Amer >60 >60 mL/min   GFR calc Af Amer >60 >60 mL/min   Anion gap 11 5 - 15   --/--/O POS (10/08 0945)  Imaging:  US Renal  Result  Date: 08/18/2017 CLINICAL DATA:  Acute left flank pain. [redacted] weeks pregnant. Nausea and vomiting. EXAM: RENAL / URINARY TRACT ULTRASOUND COMPLETE COMPARISON:  None. FINDINGS: Right Kidney: Length: 13.5 cm. Normal echogenicity in the right kidney. No hydronephrosis. There are echogenic foci in the right kidney suggestive for stones. Largest stone measures up to 0.9 cm. Left Kidney: Length: 12.1 cm. Normal echogenicity in the left kidney. Nonshadowing echogenic foci in the left kidney are also suggestive for stones. There is mild fullness of the left renal pelvis and left renal calices. Left renal pelvis measures up to 1.1 cm. Bladder: Bladder is poorly characterized on this examination. IMPRESSION: Mild left hydronephrosis. The etiology for the left hydronephrosis is unknown because it could be secondary to the pregnancy. However, there are echogenic foci in both kidneys which are suggestive for kidney stones and cannot exclude an obstructive stone. Electronically Signed   By: Markus Daft M.D.   On: 08/18/2017 18:28    MAU Course/MDM: I have ordered labs and reviewed results.  NST reviewed and reactive Preeclampsia labs done and wnl with P/C ratio of 0.17, BP normotensive after pain medication given Consult Dr Terri Piedra with presentation, exam findings and test results.  Dilaudid 1 mg IV given in MAU with significant improvement in pt pain, given with Zofran 4 mg IV [place] with renal stones/cannot rule out obstructive stone Dr Terri Piedra to bedside to see pt   Assessment: 1. Acute left flank pain     Plan: Dr Terri Piedra at bedside Admit for pain management for kidney stones and continue repeat C/S as scheduled tomorrow vs C/S tonight with continued pain management postpartum  for stones Pt stable prior to transfer from [place] Certified Nurse-Midwife 08/18/2017 8:13 PM

## 2017-08-18 NOTE — MAU Note (Signed)
Pt is having pain that's radiating from her abdomen to her back on the left side. Was seen in the office and given an antibiotic but vomited. Pain is 9/10

## 2017-08-19 ENCOUNTER — Encounter (HOSPITAL_COMMUNITY)
Admission: AD | Disposition: A | Discharge status: Home / Self Care | Payer: Self-pay | Source: Ambulatory Visit | Attending: Obstetrics and Gynecology

## 2017-08-19 ENCOUNTER — Inpatient Hospital Stay (HOSPITAL_COMMUNITY)
Admission: RE | Admit: 2017-08-19 | Payer: BLUE CROSS/BLUE SHIELD | Source: Ambulatory Visit | Admitting: Obstetrics and Gynecology

## 2017-08-19 ENCOUNTER — Inpatient Hospital Stay (HOSPITAL_COMMUNITY): Payer: BLUE CROSS/BLUE SHIELD | Admitting: Certified Registered Nurse Anesthetist

## 2017-08-19 ENCOUNTER — Encounter (HOSPITAL_COMMUNITY): Payer: Self-pay | Admitting: *Deleted

## 2017-08-19 ENCOUNTER — Inpatient Hospital Stay (HOSPITAL_COMMUNITY): Payer: BLUE CROSS/BLUE SHIELD

## 2017-08-19 DIAGNOSIS — Z98891 History of uterine scar from previous surgery: Secondary | ICD-10-CM

## 2017-08-19 LAB — RPR: RPR Ser Ql: NONREACTIVE

## 2017-08-19 SURGERY — Surgical Case
Anesthesia: Spinal | Site: Abdomen | Laterality: Bilateral | Wound class: Clean Contaminated

## 2017-08-19 MED ORDER — FENTANYL CITRATE (PF) 100 MCG/2ML IJ SOLN
INTRAMUSCULAR | Status: DC | PRN
  Administered 2017-08-19: 10 ug via INTRATHECAL

## 2017-08-19 MED ORDER — SOD CITRATE-CITRIC ACID 500-334 MG/5ML PO SOLN
ORAL | Status: AC
  Administered 2017-08-19: 30 mL
  Filled 2017-08-19: qty 15

## 2017-08-19 MED ORDER — LACTATED RINGERS IV SOLN
INTRAVENOUS | Status: DC
  Administered 2017-08-19 – 2017-08-20 (×2): via INTRAVENOUS

## 2017-08-19 MED ORDER — MENTHOL 3 MG MT LOZG: 1.0000 | LOZENGE | OROMUCOSAL | Status: DC | PRN

## 2017-08-19 MED ORDER — SENNOSIDES-DOCUSATE SODIUM 8.6-50 MG PO TABS
2.0000 | ORAL_TABLET | ORAL | Status: DC
  Administered 2017-08-19 – 2017-08-20 (×2): 2 via ORAL
  Filled 2017-08-19 (×2): qty 2

## 2017-08-19 MED ORDER — OXYTOCIN 40 UNITS IN LACTATED RINGERS INFUSION - SIMPLE MED: 2.5000 [IU]/h | INTRAVENOUS | Status: AC

## 2017-08-19 MED ORDER — SIMETHICONE 80 MG PO CHEW
80.0000 mg | CHEWABLE_TABLET | Freq: Three times a day (TID) | ORAL | Status: DC
  Administered 2017-08-20 – 2017-08-21 (×4): 80 mg via ORAL
  Filled 2017-08-19 (×6): qty 1

## 2017-08-19 MED ORDER — LACTATED RINGERS IV SOLN: INTRAVENOUS | Status: DC

## 2017-08-19 MED ORDER — COCONUT OIL OIL: 1.0000 "application " | TOPICAL_OIL | Status: DC | PRN

## 2017-08-19 MED ORDER — FENTANYL CITRATE (PF) 100 MCG/2ML IJ SOLN: 25.0000 ug | INTRAMUSCULAR | Status: DC | PRN

## 2017-08-19 MED ORDER — LACTATED RINGERS IV SOLN
INTRAVENOUS | Status: DC | PRN
  Administered 2017-08-19: 08:00:00 via INTRAVENOUS

## 2017-08-19 MED ORDER — IBUPROFEN 600 MG PO TABS
600.0000 mg | ORAL_TABLET | Freq: Four times a day (QID) | ORAL | Status: DC
  Administered 2017-08-19 – 2017-08-21 (×5): 600 mg via ORAL
  Filled 2017-08-19 (×6): qty 1

## 2017-08-19 MED ORDER — KETOROLAC TROMETHAMINE 30 MG/ML IJ SOLN
30.0000 mg | Freq: Four times a day (QID) | INTRAMUSCULAR | Status: AC | PRN
Start: 2017-08-19 — End: 2017-08-20

## 2017-08-19 MED ORDER — ACETAMINOPHEN 325 MG PO TABS: 650.0000 mg | ORAL_TABLET | ORAL | Status: DC | PRN

## 2017-08-19 MED ORDER — ZOLPIDEM TARTRATE 5 MG PO TABS: 5.0000 mg | ORAL_TABLET | Freq: Every evening | ORAL | Status: DC | PRN

## 2017-08-19 MED ORDER — PROMETHAZINE HCL 25 MG/ML IJ SOLN: 6.2500 mg | INTRAMUSCULAR | Status: DC | PRN

## 2017-08-19 MED ORDER — MORPHINE SULFATE (PF) 0.5 MG/ML IJ SOLN
INTRAMUSCULAR | Status: DC | PRN
  Administered 2017-08-19: .2 mg via INTRATHECAL

## 2017-08-19 MED ORDER — SCOPOLAMINE 1 MG/3DAYS TD PT72
1.0000 | MEDICATED_PATCH | Freq: Once | TRANSDERMAL | Status: AC
  Administered 2017-08-19: 1 via TRANSDERMAL
  Filled 2017-08-19: qty 1

## 2017-08-19 MED ORDER — DIPHENHYDRAMINE HCL 25 MG PO CAPS: 25.0000 mg | ORAL_CAPSULE | Freq: Four times a day (QID) | ORAL | Status: DC | PRN

## 2017-08-19 MED ORDER — TAMSULOSIN HCL 0.4 MG PO CAPS
0.4000 mg | ORAL_CAPSULE | Freq: Every day | ORAL | Status: DC
  Administered 2017-08-19 – 2017-08-21 (×3): 0.4 mg via ORAL
  Filled 2017-08-19 (×3): qty 1

## 2017-08-19 MED ORDER — CITALOPRAM HYDROBROMIDE 20 MG PO TABS
20.0000 mg | ORAL_TABLET | Freq: Every day | ORAL | Status: DC
  Administered 2017-08-19 – 2017-08-20 (×2): 20 mg via ORAL
  Filled 2017-08-19 (×3): qty 1

## 2017-08-19 MED ORDER — DEXAMETHASONE SODIUM PHOSPHATE 4 MG/ML IJ SOLN
INTRAMUSCULAR | Status: DC | PRN
  Administered 2017-08-19: 4 mg via INTRAVENOUS

## 2017-08-19 MED ORDER — CEFAZOLIN SODIUM-DEXTROSE 2-4 GM/100ML-% IV SOLN
2.0000 g | INTRAVENOUS | Status: AC
  Administered 2017-08-19: 2 g via INTRAVENOUS
  Filled 2017-08-19: qty 100

## 2017-08-19 MED ORDER — KETOROLAC TROMETHAMINE 30 MG/ML IJ SOLN
30.0000 mg | Freq: Four times a day (QID) | INTRAMUSCULAR | Status: AC | PRN
  Administered 2017-08-19: 30 mg via INTRAVENOUS
  Filled 2017-08-19: qty 1

## 2017-08-19 MED ORDER — SIMETHICONE 80 MG PO CHEW: 80.0000 mg | CHEWABLE_TABLET | ORAL | Status: DC | PRN

## 2017-08-19 MED ORDER — PHENYLEPHRINE 8 MG IN D5W 100 ML (0.08MG/ML) PREMIX OPTIME
INJECTION | INTRAVENOUS | Status: DC | PRN
  Administered 2017-08-19: 60 ug/min via INTRAVENOUS

## 2017-08-19 MED ORDER — DIBUCAINE 1 % RE OINT: 1.0000 "application " | TOPICAL_OINTMENT | RECTAL | Status: DC | PRN

## 2017-08-19 MED ORDER — OXYTOCIN 10 UNIT/ML IJ SOLN
INTRAVENOUS | Status: DC | PRN
  Administered 2017-08-19: 40 [IU] via INTRAVENOUS

## 2017-08-19 MED ORDER — SIMETHICONE 80 MG PO CHEW
80.0000 mg | CHEWABLE_TABLET | ORAL | Status: DC
  Administered 2017-08-19 – 2017-08-20 (×2): 80 mg via ORAL
  Filled 2017-08-19 (×2): qty 1

## 2017-08-19 MED ORDER — SODIUM CHLORIDE 0.9 % IR SOLN
Status: DC | PRN
  Administered 2017-08-19: 1000 mL

## 2017-08-19 MED ORDER — PRENATAL MULTIVITAMIN CH
1.0000 | ORAL_TABLET | Freq: Every day | ORAL | Status: DC
  Administered 2017-08-20: 1 via ORAL
  Filled 2017-08-19: qty 1

## 2017-08-19 MED ORDER — CEFAZOLIN SODIUM-DEXTROSE 1-4 GM/50ML-% IV SOLN
1.0000 g | Freq: Three times a day (TID) | INTRAVENOUS | Status: DC
  Administered 2017-08-19 – 2017-08-20 (×3): 1 g via INTRAVENOUS
  Filled 2017-08-19 (×3): qty 50

## 2017-08-19 MED ORDER — TETANUS-DIPHTH-ACELL PERTUSSIS 5-2.5-18.5 LF-MCG/0.5 IM SUSP: 0.5000 mL | Freq: Once | INTRAMUSCULAR | Status: DC

## 2017-08-19 MED ORDER — BUPIVACAINE IN DEXTROSE 0.75-8.25 % IT SOLN
INTRATHECAL | Status: DC | PRN
  Administered 2017-08-19: 1.6 mL via INTRATHECAL

## 2017-08-19 MED ORDER — WITCH HAZEL-GLYCERIN EX PADS: 1.0000 "application " | MEDICATED_PAD | CUTANEOUS | Status: DC | PRN

## 2017-08-19 SURGICAL SUPPLY — 33 items
BENZOIN TINCTURE PRP APPL 2/3 (GAUZE/BANDAGES/DRESSINGS) ×2 IMPLANT
CLOTH BEACON ORANGE TIMEOUT ST (SAFETY) ×2 IMPLANT
CLSR STERI-STRIP ANTIMIC 1/2X4 (GAUZE/BANDAGES/DRESSINGS) ×2 IMPLANT
CONTAINER PREFILL 10% NBF 15ML (MISCELLANEOUS) IMPLANT
DRSG OPSITE POSTOP 4X10 (GAUZE/BANDAGES/DRESSINGS) ×2 IMPLANT
DURAPREP 26ML APPLICATOR (WOUND CARE) ×2 IMPLANT
ELECT REM PT RETURN 9FT ADLT (ELECTROSURGICAL) ×2
ELECTRODE REM PT RTRN 9FT ADLT (ELECTROSURGICAL) ×1 IMPLANT
EXTRACTOR VACUUM KIWI (MISCELLANEOUS) IMPLANT
GLOVE BIO SURGEON STRL SZ 6.5 (GLOVE) ×2 IMPLANT
GLOVE BIOGEL PI IND STRL 7.0 (GLOVE) ×1 IMPLANT
GLOVE BIOGEL PI INDICATOR 7.0 (GLOVE) ×1
GOWN STRL REUS W/TWL LRG LVL3 (GOWN DISPOSABLE) ×4 IMPLANT
KIT ABG SYR 3ML LUER SLIP (SYRINGE) IMPLANT
NEEDLE HYPO 25X5/8 SAFETYGLIDE (NEEDLE) IMPLANT
NS IRRIG 1000ML POUR BTL (IV SOLUTION) ×2 IMPLANT
PACK C SECTION WH (CUSTOM PROCEDURE TRAY) ×2 IMPLANT
PAD OB MATERNITY 4.3X12.25 (PERSONAL CARE ITEMS) ×2 IMPLANT
PENCIL SMOKE EVAC W/HOLSTER (ELECTROSURGICAL) ×2 IMPLANT
RTRCTR C-SECT PINK 25CM LRG (MISCELLANEOUS) ×2 IMPLANT
STRIP CLOSURE SKIN 1/2X4 (GAUZE/BANDAGES/DRESSINGS) IMPLANT
SUT CHROMIC 1 CTX 36 (SUTURE) ×4 IMPLANT
SUT PLAIN 0 NONE (SUTURE) IMPLANT
SUT PLAIN 2 0 XLH (SUTURE) ×2 IMPLANT
SUT VIC AB 0 CT1 27 (SUTURE) ×2
SUT VIC AB 0 CT1 27XBRD ANBCTR (SUTURE) ×2 IMPLANT
SUT VIC AB 2-0 CT1 27 (SUTURE) ×1
SUT VIC AB 2-0 CT1 TAPERPNT 27 (SUTURE) ×1 IMPLANT
SUT VIC AB 3-0 CT1 27 (SUTURE) ×1
SUT VIC AB 3-0 CT1 TAPERPNT 27 (SUTURE) ×1 IMPLANT
SUT VIC AB 4-0 KS 27 (SUTURE) ×2 IMPLANT
TOWEL OR 17X24 6PK STRL BLUE (TOWEL DISPOSABLE) ×2 IMPLANT
TRAY FOLEY BAG SILVER LF 14FR (SET/KITS/TRAYS/PACK) ×2 IMPLANT

## 2017-08-19 NOTE — Progress Notes (Signed)
Patient ID: Rebecca Armstrong, female   DOB: [birth year], 36 y.o.   MRN: [id number] D/w Dr. Berneice Heinrich of urology.  He recommends going ahead and getting CT scan to determine definitively if stone and size if present.  Will get ordered and keep NPO until can be assessed.

## 2017-08-19 NOTE — Consult Note (Signed)
Reason for Consult: Left Ureteral Zenia Resides  Referring Physician: Paula Compton MD  Rebecca Armstrong is an 36 y.o. female.   HPI:   1 - Left Ureteral Stone - 3.23m left distal stone at iliac crossing by CT 08/19/09 after delivery of baby by Cesarean on eval left flank pain. Also bilateral intra-renal stones about 836mtotal voluem each side. Minimal hydro. Cr 0.53. No prior colic.   2 - Bacteruria - bacteruria noted on intake UA at time of csection 08/2017. No fevers, No leukocytosis. On empiric kefxol. UCX 10/9 pending.   PMH sig for caesarian, now tubal. No CV disease / blood thinners.   Today "AsKeiis seen in consultation for above.   Past Medical History:  Diagnosis Date  . Depression    celexa '20mg'$ , restarted after twins were born and stayed on it.  . Marland Kitchenysrhythmia    history of palpitations-EKG and workup in 04/2011--NSR with PAC's-avoid stimulants  . Migraine   . Newborn product of IVF pregnancy     Past Surgical History:  Procedure Laterality Date  . CESAREAN SECTION  09/01/2012   Procedure: CESAREAN SECTION;  Surgeon: KaLogan BoresMD;  [facility];  Service: Obstetrics;  Laterality: N/A;  Primay c/s-TWINS  . CESAREAN SECTION WITH BILATERAL TUBAL LIGATION Bilateral 08/19/2017   Procedure: CESAREAN SECTION WITH BILATERAL TUBAL LIGATION;  Surgeon: RiPaula ComptonMD;  [facility] Service: Obst[facility]s;  Laterality: Bilateral;  Tracey RNFA  . egg retrieval  2013   IVF-Propofol    Family History  Problem Relation Age of Onset  . Alcohol abuse Mother   . Miscarriages / StKoreaother   . Fibromyalgia Mother   . Hypertension Mother   . Melanoma Father   . Hypertension Maternal Grandmother   . Multiple myeloma Maternal Grandfather   . Breast cancer Paternal Grandmother   . Depression Paternal Grandmother   . Heart attack Paternal Grandfather [id number]. Transient ischemic attack Unknown        MG aunt  . Sudden death Paternal  AuAunt 74. Hypertension Unknown        MGGM    Social History:  reports that she has never smoked. She has never used smokeless tobacco. She reports that she drinks alcohol. She reports that she does not use drugs.  Allergies:  Allergies  Allergen Reactions  . Codeine Nausea And Vomiting    Medications: I have reviewed the patient's current medications.  Results for orders placed or performed during the hospital encounter of 08/18/17 (from the past 48 hour(s))  Urinalysis, Routine w reflex microscopic     Status: Abnormal   Collection Time: 08/18/17  4:00 PM  Result Value Ref Range   Color, Urine YELLOW YELLOW   APPearance HAZY (A) CLEAR   Specific Gravity, Urine 1.025 1.005 - 1.030   pH 5.0 5.0 - 8.0   Glucose, UA NEGATIVE NEGATIVE mg/dL   Hgb urine dipstick LARGE (A) NEGATIVE   Bilirubin Urine NEGATIVE NEGATIVE   Ketones, ur 20 (A) NEGATIVE mg/dL   Protein, ur 30 (A) NEGATIVE mg/dL   Nitrite NEGATIVE NEGATIVE   Leukocytes, UA TRACE (A) NEGATIVE   RBC / HPF TOO NUMEROUS TO COUNT 0 - 5 RBC/hpf   WBC, UA 6-30 0 - 5 WBC/hpf   Bacteria, UA MANY (A) NONE SEEN   Squamous Epithelial / LPF 0-5 (A) NONE SEEN   Mucus PRESENT   Protein / creatinine ratio, urine     Status: Abnormal  Collection Time: 08/18/17  4:00 PM  Result Value Ref Range   Creatinine, Urine 162.00 mg/dL   Total Protein, Urine 27 mg/dL    Comment: NO NORMAL RANGE ESTABLISHED FOR THIS TEST   Protein Creatinine Ratio 0.17 (H) 0.00 - 0.15 mg/mg[Cre]  Comprehensive metabolic panel     Status: Abnormal   Collection Time: 08/18/17  5:06 PM  Result Value Ref Range   Sodium 135 135 - 145 mmol/L   Potassium 3.9 3.5 - 5.1 mmol/L   Chloride 105 101 - 111 mmol/L   CO2 19 (L) 22 - 32 mmol/L   Glucose, Bld 124 (H) 65 - 99 mg/dL   BUN 12 6 - 20 mg/dL   Creatinine, Ser 0.53 0.44 - 1.00 mg/dL   Calcium 9.0 8.9 - 10.3 mg/dL   Total Protein 6.4 (L) 6.5 - 8.1 g/dL   Albumin 3.0 (L) 3.5 - 5.0 g/dL   AST 24 15 - 41 U/L    ALT 17 14 - 54 U/L   Alkaline Phosphatase 196 (H) 38 - 126 U/L   Total Bilirubin 0.6 0.3 - 1.2 mg/dL   GFR calc non Af Amer >60 >60 mL/min   GFR calc Af Amer >60 >60 mL/min    Comment: (NOTE) The eGFR has been calculated using the CKD EPI equation. This calculation has not been validated in all clinical situations. eGFR's persistently <60 mL/min signify possible Chronic Kidney Disease.    Anion gap 11 5 - 15    US Renal  Result Date: 08/18/2017 CLINICAL DATA:  Acute left flank pain. [redacted] weeks pregnant. Nausea and vomiting. EXAM: RENAL / URINARY TRACT ULTRASOUND COMPLETE COMPARISON:  None. FINDINGS: Right Kidney: Length: 13.5 cm. Normal echogenicity in the right kidney. No hydronephrosis. There are echogenic foci in the right kidney suggestive for stones. Largest stone measures up to 0.9 cm. Left Kidney: Length: 12.1 cm. Normal echogenicity in the left kidney. Nonshadowing echogenic foci in the left kidney are also suggestive for stones. There is mild fullness of the left renal pelvis and left renal calices. Left renal pelvis measures up to 1.1 cm. Bladder: Bladder is poorly characterized on this examination. IMPRESSION: Mild left hydronephrosis. The etiology for the left hydronephrosis is unknown because it could be secondary to the pregnancy. However, there are echogenic foci in both kidneys which are suggestive for kidney stones and cannot exclude an obstructive stone. Electronically Signed   By: Markus Daft M.D.   On: 08/18/2017 18:28   Ct Renal Stone Study  Result Date: 08/19/2017 CLINICAL DATA:  Recent Cesarean section. Development of flank pain and hematuria. Assess for renal stone disease. EXAM: CT ABDOMEN AND PELVIS WITHOUT CONTRAST TECHNIQUE: Multidetector CT imaging of the abdomen and pelvis was performed following the standard protocol without IV contrast. COMPARISON:  Ultrasound 08/18/2017 FINDINGS: Lower chest: Normal Hepatobiliary: Several calcified gallstones in the gallbladder neck.  No sign of cholecystitis or obstruction. Hepatic parenchyma normal. Pancreas: Normal Spleen: Normal Adrenals/Urinary Tract: Adrenal glands are normal. Right kidney shows 7 small stones, the largest measuring approximately 4 mm in the midportion. No evidence of passing stone. The left kidney shows approximately 10 small stones, the largest 3 mm. Mild fullness of the left renal collecting system and ureter. 3 mm stone in the left ureter at the mid pelvis level. Foley catheter in the bladder. Stomach/Bowel: Normal Vascular/Lymphatic: Normal Reproductive: Changes of recent cesarean section and delivery. No unexpected findings. Other: None significant Musculoskeletal: Normal IMPRESSION: Multiple bilateral renal calculi. 3 mm stone in  the left ureter at the mid pelvis level with mild left hydronephrosis. Chololithiasis without CT evidence cholecystitis. Findings of recent C-section as expected. Electronically Signed   By: Nelson Chimes M.D.   On: 08/19/2017 10:59    Review of Systems  Constitutional: Negative.  Negative for fever.  HENT: Negative.   Eyes: Negative.   Respiratory: Negative.   Cardiovascular: Negative.   Gastrointestinal: Positive for nausea.  Genitourinary: Positive for flank pain.  Skin: Negative.   Neurological: Negative.   Endo/Heme/Allergies: Negative.   Psychiatric/Behavioral: Negative.    Blood pressure 128/82, pulse 77, temperature 98.8 F (37.1 C), temperature source Oral, resp. rate 16, SpO2 98 %, unknown if currently breastfeeding. Physical Exam  Constitutional: She is oriented to person, place, and time. She appears well-developed.  Family at bedside  HENT:  Head: Normocephalic.  Eyes: Pupils are equal, round, and reactive to light.  Cardiovascular: Normal rate.   Respiratory: Effort normal.  GI: Soft.  Genitourinary:  Genitourinary Comments: Foley in place draining yellow urine. No CVAT at present.   Neurological: She is alert and oriented to person, place, and time.   Skin: Skin is warm.  Psychiatric: She has a normal mood and affect.    Assessment/Plan:   1 - Left Ureteral / Bilateral Renal Stone - discussed management options for current obstructing stone including medical therapy (>70% chance passage), ureteroscopy, shockwave lithotripsy in detail. We all agree on medical therapy with Percocet PRN, Zofran PRN, Tamsulosin daily. Consider ureteroscopy if colic becomes refractory of fails to pass in few weeks.   2 - Bacteruria - UCX today for baseline. Rec 5 days keflex at DC. Should she develop fevers / systemic infection, ureteral stenting would be necessary.   We will arrange GU follow up at our office. Please call with questions.   Adelin Ventrella 08/19/2017, 11:04 AM

## 2017-08-19 NOTE — Anesthesia Postprocedure Evaluation (Signed)
Anesthesia Post Note  Patient: Rebecca Armstrong  Procedure(s) Performed: CESAREAN SECTION WITH BILATERAL TUBAL LIGATION (Bilateral Abdomen)     Patient location during evaluation: PACU Anesthesia Type: Spinal Level of consciousness: awake and alert Pain management: pain level controlled Vital Signs Assessment: post-procedure vital signs reviewed and stable Respiratory status: spontaneous breathing and respiratory function stable Cardiovascular status: blood pressure returned to baseline and stable Postop Assessment: spinal receding and no apparent nausea or vomiting Anesthetic complications: no    Last Vitals:  Vitals:   08/19/17 0945 08/19/17 1000  BP: 125/77 130/83  Pulse: 75 77  Resp: 11 (!) 25  Temp:    SpO2: 99% 99%    Last Pain:  Vitals:   08/19/17 1000  TempSrc:   PainSc: 0-No pain   Pain Goal: Patients Stated Pain Goal: 2 (08/18/17 2115)               Beryle Lathe

## 2017-08-19 NOTE — Progress Notes (Signed)
Patient in CT from 1025 and 1050 between time in PACU and MBW.

## 2017-08-19 NOTE — Anesthesia Procedure Notes (Signed)
Spinal  Patient location during procedure: OR Start time: 08/19/2017 7:37 AM End time: 08/19/2017 7:41 AM Staffing Anesthesiologist: Leslye Peer E Performed: anesthesiologist  Preanesthetic Checklist Completed: patient identified, surgical consent, pre-op evaluation, timeout performed, IV checked, risks and benefits discussed and monitors and equipment checked Spinal Block Patient position: sitting Prep: DuraPrep Patient monitoring: heart rate, cardiac monitor, continuous pulse ox and blood pressure Approach: midline Location: L3-4 Injection technique: single-shot Needle Needle type: Pencan  Needle gauge: 24 G Additional Notes Functioning IV was confirmed and monitors were applied. Sterile prep and drape, including hand hygiene, mask, and sterile gloves were used. The patient was positioned and the spine was prepped. The skin was anesthetized with lidocaine. Free flow of clear CSF was obtained prior to injecting local anesthetic into the CSF. The spinal needle aspirated freely following injection. The needle was carefully withdrawn. The patient tolerated the procedure well. Consent was obtained prior to the procedure with all questions answered and concerns addressed. Risks including, but not limited to, bleeding, infection, nerve damage, paralysis, failed block, inadequate analgesia, allergic reaction, high spinal, itching, and headache were discussed and the patient wished to proceed.  Leslye Peer, MD

## 2017-08-19 NOTE — Progress Notes (Signed)
Consult note of Urologist discussed with Dr Ashok Norris.  Patient is not going to have further treatment and can now eat.  Dr. Ashok Norris is coming in to the hospital and will discuss consult findings with patient.

## 2017-08-19 NOTE — Progress Notes (Signed)
Patient ID: Rebecca Armstrong, female   DOB: [birth year], 36 y.o.   MRN: [id number] Pt has continued to have flank pain over night q 3-4 when dilaudid wore off and needed another dose.  Just received dilaudid and zofran at 0700 and comfortable.  Had vomiting when tried to eat last PM, but no nausea now.    Afeb vss Abdomen gravid NT EFM category 1  Pt has been counseled on risks and benefits of c-section in detail including risk of bleeding, infection and possible damage to bowel and bladder. She also elects a tubal ligation for permanent sterility.  We discussed risk of failure of 1/100 and increased risk of ectopic should pregnancy occurs.   I suspect her spinal will keep her comfortable with the presumed kidney stone for a while, but as that wears off will have to monitor to see if passes and get urology involved if does not.

## 2017-08-19 NOTE — Transfer of Care (Signed)
Immediate Anesthesia Transfer of Care Note  Patient: Rebecca Armstrong  Procedure(s) Performed: CESAREAN SECTION WITH BILATERAL TUBAL LIGATION (Bilateral Abdomen)  Patient Location: PACU  Anesthesia Type:Spinal  Level of Consciousness: awake, alert  and oriented  Airway & Oxygen Therapy: Patient Spontanous Breathing  Post-op Assessment: Report given to RN and Post -op Vital signs reviewed and stable  Post vital signs: Reviewed and stable  Last Vitals:  Vitals:   08/18/17 2337 08/19/17 0614  BP: (!) 146/86 (!) 141/99  Pulse: 100 (!) 110  Resp: 18 18  Temp: 36.8 C 36.8 C  SpO2: 99% 96%    Last Pain:  Vitals:   08/19/17 0614  TempSrc: Oral  PainSc:       Patients Stated Pain Goal: 2 (08/18/17 2115)  Complications: No apparent anesthesia complications

## 2017-08-19 NOTE — Op Note (Addendum)
Operative Note    Preoperative Diagnosis Term pregnancy at 39 2/7 weeks Prior c-section, declines TOL Desires permanent sterility  Postoperative Diagnosis same  Procedure Repeat low transverse c-section with two layer closure of uterus Bilateral sterilization with distal salpingectomies  Surgeon Huel Cote, MD Mamie Levers, RNFA  Anesthesia Spinal  Fluids: EBL UOP 50mL IVF  Findings A viable female infant in the vertex presentation, Apgars 8, 9 Weight pending.  Normal uterus, ovaries and tubes.  No pathology palpated on the left.  Specimen Placenta to L&D Bilateral tubal segments to pathology  Procedure Note Patient was taken to the operating room where spinal anesthesia was obtained and found to be adequate by Allis clamp test. She was prepped and draped in the normal sterile fashion in the dorsal supine position with a leftward tilt. An appropriate time out was performed. A Pfannenstiel skin incision was then made through a pre-existing scar with the scalpel and carried through to the underlying layer of fascia by sharp dissection and Bovie cautery. The fascia was nicked in the midline and the incision was extended laterally with Mayo scissors. The inferior aspect of the incision was grasped Coker clamps and dissected off the underlying rectus muscles. In a similar fashion the superior aspect was dissected off the rectus muscles. Rectus muscles were separated in the midline and the peritoneal cavity entered bluntly. The peritoneal incision was then extended both superiorly and inferiorly with careful attention to avoid both bowel and bladder. The Alexis self-retaining wound retractor was then placed within the incision and the lower uterine segment exposed. The bladder flap was developed with Metzenbaum scissors and pushed away from the lower uterine segment. The lower uterine segment was then incised in a transverse fashion and the cavity itself entered  bluntly. The incision was extended bluntly. The infant's head was then lifted and delivered from the incision without difficulty. The remainder of the infant delivered and the nose and mouth bulb suctioned with the cord clamped and cut as well. The infant was handed off to the waiting pediatricians. The placenta was then spontaneously expressed from the uterus and the uterus cleared of all clots and debris with moist lap sponge. The uterine incision was then repaired in 2 layers the first layer was a running locked layer of 1-0 chromic and the second an imbricating layer of the same suture. The tubes and ovaries were inspected and the gutters cleared of all clots and debris.  The fallopian tubes were identified, traced out to their fimbriated end and the distal 1/3 of tube clamped in a Kelly clamp and amputated.  The pedicle was secured with 2 suture ligatures of 3-0 vicryl bilaterally. Both pedicles were hemostatic.   The uterine incision was inspected and found to be hemostatic. All instruments and sponges as well as the Alexis retractor were then removed from the abdomen. The rectus muscles and peritoneum were then reapproximated with a running suture of 2-0 Vicryl. The fascia was then closed with 0 Vicryl in a running fashion. Subcutaneous tissue was reapproximated with 3-0 plain in a running fashion. The skin was closed with a subcuticular stitch of 4-0 Vicryl on a Keith needle and then reinforced with benzoin and Steri-Strips. At the conclusion of the procedure all instruments and sponge counts were correct. Patient was taken to the recovery room in good condition with her baby accompanying her skin to skin.   D/w pt circumcision and they desire in the hospital.

## 2017-08-20 LAB — CBC
HEMATOCRIT: 30.8 % — AB (ref 36.0–46.0)
Hemoglobin: 10.2 g/dL — ABNORMAL LOW (ref 12.0–15.0)
MCH: 26.6 pg (ref 26.0–34.0)
MCHC: 33.1 g/dL (ref 30.0–36.0)
MCV: 80.4 fL (ref 78.0–100.0)
PLATELETS: 174 10*3/uL (ref 150–400)
RBC: 3.83 MIL/uL — ABNORMAL LOW (ref 3.87–5.11)
RDW: 15.7 % — ABNORMAL HIGH (ref 11.5–15.5)
WBC: 8.3 10*3/uL (ref 4.0–10.5)

## 2017-08-20 LAB — URINE CULTURE

## 2017-08-20 MED ORDER — DIPHENHYDRAMINE HCL 50 MG/ML IJ SOLN: 12.5000 mg | INTRAMUSCULAR | Status: DC | PRN

## 2017-08-20 MED ORDER — CEPHALEXIN 500 MG PO CAPS
500.0000 mg | ORAL_CAPSULE | Freq: Three times a day (TID) | ORAL | Status: DC
  Administered 2017-08-20 – 2017-08-21 (×3): 500 mg via ORAL
  Filled 2017-08-20 (×3): qty 1

## 2017-08-20 MED ORDER — SODIUM CHLORIDE 0.9% FLUSH: 3.0000 mL | INTRAVENOUS | Status: DC | PRN

## 2017-08-20 MED ORDER — NALOXONE HCL 0.4 MG/ML IJ SOLN: 0.4000 mg | INTRAMUSCULAR | Status: DC | PRN

## 2017-08-20 MED ORDER — NALOXONE HCL 2 MG/2ML IJ SOSY
1.0000 ug/kg/h | PREFILLED_SYRINGE | INTRAVENOUS | Status: DC | PRN
  Filled 2017-08-20: qty 2

## 2017-08-20 MED ORDER — DIPHENHYDRAMINE HCL 25 MG PO CAPS
25.0000 mg | ORAL_CAPSULE | ORAL | Status: DC | PRN
Start: 2017-08-20 — End: 2017-08-21

## 2017-08-20 MED ORDER — NALBUPHINE HCL 10 MG/ML IJ SOLN: 5.0000 mg | Freq: Once | INTRAMUSCULAR | Status: DC | PRN

## 2017-08-20 MED ORDER — ACETAMINOPHEN 500 MG PO TABS
1000.0000 mg | ORAL_TABLET | Freq: Four times a day (QID) | ORAL | Status: AC
  Administered 2017-08-21: 1000 mg via ORAL
  Filled 2017-08-20: qty 2

## 2017-08-20 MED ORDER — NALBUPHINE HCL 10 MG/ML IJ SOLN: 5.0000 mg | INTRAMUSCULAR | Status: DC | PRN

## 2017-08-20 MED ORDER — ONDANSETRON HCL 4 MG/2ML IJ SOLN: 4.0000 mg | Freq: Three times a day (TID) | INTRAMUSCULAR | Status: DC | PRN

## 2017-08-20 NOTE — Progress Notes (Signed)
MOB was referred for history of depression/anxiety. * Referral screened out by Clinical Social Worker because none of the following criteria appear to apply: ~ History of anxiety/depression during this pregnancy, or of post-partum depression. ~ Diagnosis of anxiety and/or depression within last 3 years OR * MOB's symptoms currently being treated with medication and/or therapy. Please contact the Clinical Social Worker if needs arise, by MOB request, or if MOB scores greater than 9/yes to question 10 on Edinburgh Postpartum Depression Screen.  Lasasha Brophy Boyd-Gilyard, MSW, LCSW Clinical Social Work [phone] 

## 2017-08-20 NOTE — Plan of Care (Signed)
Problem: Pain Managment: Goal: General experience of comfort will improve Outcome: Progressing Pt reports pain is 0/10 when she is resting but is a 5-6/10 when she stands up or sits down. Pt declines tylenol, stating that she does not feel that it has ever worked for her and she declines medication stronger than motrin because she is concerned about the drowsiness.  Will continue to monitor and reassess need for pain medication.  Problem: Physical Regulation: Goal: Ability to maintain clinical measurements within normal limits will improve Outcome: Progressing Pt reports she has voided 3 more times since her last recorded void.  Pt reports she stopped using the "hat" to collect the urine because she is no longer having symptoms of a kidney stone.  Encouraged pt to continue to void in the "hat" and allow staff to filter the urine to check for the kidney stone until the MD advises her to stop.  Pt verbalized understanding.

## 2017-08-20 NOTE — Progress Notes (Signed)
Subjective: Postpartum Day 1: Cesarean Delivery/BTL Patient reports incisional pain and tolerating PO.  Also kidney stone    Objective: Vital signs in last 24 hours: Temp:  [97.5 F (36.4 C)-99 F (37.2 C)] 97.5 F (36.4 C) (10/10 0653) Pulse Rate:  [59-99] 68 (10/10 0653) Resp:  [11-25] 18 (10/10 0653) BP: (107-133)/(65-83) 107/77 (10/10 0653) SpO2:  [95 %-99 %] 95 % (10/09 1753)  Physical Exam:  General: alert and no distress Lochia: appropriate Uterine Fundus: firm Incision: healing well DVT Evaluation: No evidence of DVT seen on physical exam.   Recent Labs  08/18/17 0944 08/20/17 0610  HGB 12.6 10.2*  HCT 36.7 30.8*    Assessment/Plan: Status post Cesarean section. Doing well postoperatively.  Continue current care.  Rebecca Armstrong 08/20/2017, 7:32 AM

## 2017-08-20 NOTE — Plan of Care (Signed)
Problem: Urinary Elimination: Goal: Ability to reestablish a normal urinary elimination pattern will improve Outcome: Completed/Met Date Met: 08/20/17 Pt voiding without difficulty post urinary catheter removal.  Pt denies pain with urination.  Urine continuing to be filtered, checking for kidney stones.  Pt denies pain anywhere other than at her surgical site. Will continue to monitor.

## 2017-08-20 NOTE — Procedures (Deleted)
Circumcision Note:  Procedure reviewed with mother, including r/b/a. ID verified Ring block with 1% lidocaine Circumcision with 1.1 gomco, without difficulty or complication Hemostatic with gelfoam

## 2017-08-21 MED ORDER — CEPHALEXIN 500 MG PO CAPS: 500.0000 mg | ORAL_CAPSULE | Freq: Three times a day (TID) | ORAL | 0 refills | Status: AC

## 2017-08-21 NOTE — Progress Notes (Signed)
Subjective: Postpartum Day 2 Cesarean Delivery/passing kidney stone Patient reports tolerating PO, + flatus and no problems voiding.    Objective: Vital signs in last 24 hours: Temp:  [97.9 F (36.6 C)-98.1 F (36.7 C)] 98.1 F (36.7 C) (10/11 0534) Pulse Rate:  [67-76] 67 (10/11 0534) Resp:  [17-18] 18 (10/11 0534) BP: (128-139)/(79-80) 128/80 (10/11 0534)  Physical Exam:  General: alert and cooperative Lochia: appropriate Uterine Fundus: firm Incision: C/D/I    Recent Labs  08/20/17 0610  HGB 10.2*  HCT 30.8*    Assessment/Plan: Status post Cesarean section. Doing well postoperatively.  Discharge home with standard precautions and return to office in 2 weeks.  Rebecca Armstrong 08/21/2017, 10:05 AM

## 2017-08-21 NOTE — Discharge Summary (Signed)
OB Discharge Summary     Patient Name: Rebecca Armstrong DOB: 05/27/1981 MRN: [id number]  Date of admission: 08/18/2017 Delivering MD: Huel Cote   Date of discharge: 08/21/2017  Admitting diagnosis: pain kidney stone repeat c-section, sterilization Intrauterine pregnancy: [redacted]w[redacted]d     Secondary diagnosis:  Active Problems:   Nephrolithiasis   Status post repeat low transverse cesarean section   S/P repeat low transverse C-section  Additional problems: as above     Discharge diagnosis: Term Pregnancy Delivered                                                                                                Post partum procedures:none  Complications: None  Hospital course:  Sceduled C/S   36 y.o. yo G2P2003 at [redacted]w[redacted]d was admitted to the hospital 08/18/2017 for a kidney stone and acute pain related to that.  She had pain management and was kept overnight with c-section proceeding as scheduled.   scheduled cesarean section with the following indication:Elective Repeat.  Membrane Rupture Time/Date: 8:05 AM ,08/19/2017   Patient delivered a Viable infant.08/19/2017  Details of operation can be found in separate operative note.  Patient passed her kidney stone shortly after the c-section and then had an uncomplicated postpartum course.  Dr. Berneice Heinrich of urology consulted on patient and will f/u with her outpatient for the stones she still has in her kidney. She is ambulating, tolerating a regular diet, passing flatus, and urinating well. Patient is discharged home in stable condition on  08/21/17         Physical exam  Vitals:   08/20/17 0653 08/20/17 0850 08/20/17 1846 08/21/17 0534  BP: 107/77 126/69 139/79 128/80  Pulse: 68 76 76 67  Resp: Temp: (!) 97.5 F (36.4 C) 98.2 F (36.8 C) 97.9 F (36.6 C) 98.1 F (36.7 C)  TempSrc:  Oral Oral Oral  SpO2:  99%     General: alert and cooperative Lochia: appropriate Uterine Fundus: firm Incision: Dressing is clean, dry, and  intact DVT Evaluation: No evidence of DVT seen on physical exam. Labs: Lab Results  Component Value Date   WBC 8.3 08/20/2017   HGB 10.2 (L) 08/20/2017   HCT 30.8 (L) 08/20/2017   MCV 80.4 08/20/2017   PLT 174 08/20/2017   CMP Latest Ref Rng & Units 08/18/2017  Glucose 65 - 99 mg/dL 409(W)  BUN 6 - 20 mg/dL 12  Creatinine 1.19 - 1.47 mg/dL 8.29  Sodium 562 - 130 mmol/L 135  Potassium 3.5 - 5.1 mmol/L 3.9  Chloride 101 - 111 mmol/L 105  CO2 22 - 32 mmol/L 19(L)  Calcium 8.9 - 10.3 mg/dL 9.0  Total Protein 6.5 - 8.1 g/dL 6.4(L)  Total Bilirubin 0.3 - 1.2 mg/dL 0.6  Alkaline Phos 38 - 126 U/L 196(H)  AST 15 - 41 U/L 24  ALT 14 - 54 U/L 17    Discharge instruction: per After Visit Summary and "Baby and Me Booklet".  After visit meds:  Allergies as of 08/21/2017      Reactions   Codeine Nausea And Vomiting  Medication List    STOP taking these medications   benzonatate 200 MG capsule Commonly known as:  TESSALON   calcium carbonate 500 MG chewable tablet Commonly known as:  TUMS - dosed in mg elemental calcium   ferrous sulfate 325 (65 FE) MG EC tablet   mometasone 50 MCG/ACT nasal spray Commonly known as:  NASONEX   mupirocin ointment 2 % Commonly known as:  BACTROBAN   NEXPLANON 68 MG Impl implant Generic drug:  etonogestrel     TAKE these medications   acetaminophen 500 MG tablet Commonly known as:  TYLENOL Take 1,000 mg by mouth every 6 (six) hours as needed for mild pain.   cephALEXin 500 MG capsule Commonly known as:  KEFLEX Take 1 capsule (500 mg total) by mouth 3 (three) times daily.   citalopram 20 MG tablet Commonly known as:  CELEXA Take 20 mg by mouth daily.   prenatal multivitamin Tabs tablet Take 1 tablet by mouth daily at 12 noon.   SUPER B COMPLEX PO Take 1 tablet by mouth daily.       Diet: routine diet  Activity: Advance as tolerated. Pelvic rest for 6 weeks.   Outpatient follow up:2 weeks Follow up Appt:No future  appointments. Follow up Visit:No Follow-up on file.  Postpartum contraception: Tubal Ligation  Newborn Data: Live born female  Birth Weight: 9 lb 3.3 oz (4175 g) APGAR: 8, 8  Newborn Delivery   Birth date/time:  08/19/2017 08:05:00 Delivery type:  C-Section, Low Transverse  C-section categorization:  Repeat     Baby Feeding: Breast Disposition:home with mother   08/21/2017 Oliver Pila, MD

## 2017-09-02 DIAGNOSIS — L814 Other melanin hyperpigmentation: Secondary | ICD-10-CM | POA: Diagnosis not present

## 2017-09-02 DIAGNOSIS — L918 Other hypertrophic disorders of the skin: Secondary | ICD-10-CM | POA: Diagnosis not present

## 2017-09-02 DIAGNOSIS — L821 Other seborrheic keratosis: Secondary | ICD-10-CM | POA: Diagnosis not present

## 2017-09-02 DIAGNOSIS — D229 Melanocytic nevi, unspecified: Secondary | ICD-10-CM | POA: Diagnosis not present

## 2017-09-03 DIAGNOSIS — O139 Gestational [pregnancy-induced] hypertension without significant proteinuria, unspecified trimester: Secondary | ICD-10-CM | POA: Diagnosis not present

## 2017-09-09 DIAGNOSIS — R8271 Bacteriuria: Secondary | ICD-10-CM | POA: Diagnosis not present

## 2017-09-09 DIAGNOSIS — N202 Calculus of kidney with calculus of ureter: Secondary | ICD-10-CM | POA: Diagnosis not present

## 2017-09-16 ENCOUNTER — Other Ambulatory Visit: Payer: Self-pay | Admitting: Urology

## 2017-09-18 DIAGNOSIS — O139 Gestational [pregnancy-induced] hypertension without significant proteinuria, unspecified trimester: Secondary | ICD-10-CM | POA: Diagnosis not present

## 2017-10-06 ENCOUNTER — Encounter (HOSPITAL_COMMUNITY): Payer: Self-pay

## 2017-10-06 NOTE — Patient Instructions (Addendum)
Nelva Nayshleigh Keltner  10/06/2017   Your procedure is scheduled on: Friday, Nov. 30, 2018   [facility]   Take [facility]  elevators to 3rd floor to  Short Stay Center at 7:30 AM.    Call this number if you have problems the morning of surgery [phone]    Remember: ONLY 1 PERSON MAY GO WITH YOU TO SHORT STAY TO GET  READY MORNING OF YOUR SURGERY.   Do not eat food or drink liquids :After Midnight.    Take these medicines the morning of surgery with A SIP OF WATER:  Labetalol                               You may not have any metal on your body including hair pins, jewelry, and body piercings             Do not wear  make-up, lotions, powders, perfumes, or deodorant             Do not wear nail polish.  Do not shave  48 hours prior to surgery.    Do not bring valuables to the hospital. [facility] IS NOT             RESPONSIBLE   FOR VALUABLES.   Contacts, dentures or bridgework may not be worn into surgery.    Patients discharged the day of surgery will not be allowed to drive home.   Name and phone number of your driver:  Chuck RUEA-[id number]-[phone]-[phone]  Special Instructions: N/A              Please read over the following fact sheets you were given: _____________________________________________________________________             [facility] - Preparing [facility] Before surgery, you can play an important role.  Because skin is not sterile, your skin needs to be as free of germs as possible.  You can reduce the number of germs on your skin by washing with CHG (chlorahexidine gluconate) soap before surgery.  CHG is an antiseptic cleaner which kills germs and bonds with the skin to continue killing germs even after washing. Please DO NOT use if you have an allergy to CHG or antibacterial soaps.  If your skin becomes reddened/irritated stop using the CHG and inform your nurse when you arrive at Short Stay. Do not shave (including legs  and underarms) for at least 48 hours prior to the first CHG shower.  You may shave your face/neck.  Please follow these instructions carefully:  1.  Shower with CHG Soap the night before surgery and the  morning of surgery.  2.  If you choose to wash your hair, wash your hair first as usual with your normal  shampoo.  3.  After you shampoo, rinse your hair and body thoroughly to remove the shampoo.                             4.  Use CHG as you would any other liquid soap.  You can apply chg directly to the skin and wash.  Gently with a scrungie or clean washcloth.  5.  Apply the CHG Soap to your body ONLY FROM THE NECK DOWN.   Do   not use on face/  open                           Wound or open sores. Avoid contact with eyes, ears mouth and   genitals (private parts).                       Wash face,  Genitals (private parts) with your normal soap.             6.  Wash thoroughly, paying special attention to the area where your    surgery  will be performed.  7.  Thoroughly rinse your body with warm water from the neck down.  8.  DO NOT shower/wash with your normal soap after using and rinsing off the CHG Soap.                9.  Pat yourself dry with a clean towel.            10.  Wear clean pajamas.            11.  Place clean sheets on your bed the night of your first shower and do not  sleep with pets. Day of Surgery : Do not apply any lotions/deodorants the morning of surgery.  Please wear clean clothes to the hospital/surgery center.  FAILURE TO FOLLOW THESE INSTRUCTIONS MAY RESULT IN THE CANCELLATION OF YOUR SURGERY  PATIENT SIGNATURE_________________________________  NURSE SIGNATURE__________________________________  ________________________________________________________________________

## 2017-10-06 NOTE — Pre-Procedure Instructions (Signed)
The following are in epic: CT renal 08/19/17 US renal 08/18/17

## 2017-10-07 DIAGNOSIS — I10 Essential (primary) hypertension: Secondary | ICD-10-CM | POA: Diagnosis not present

## 2017-10-07 DIAGNOSIS — Z1389 Encounter for screening for other disorder: Secondary | ICD-10-CM | POA: Diagnosis not present

## 2017-10-07 DIAGNOSIS — Z3009 Encounter for other general counseling and advice on contraception: Secondary | ICD-10-CM | POA: Diagnosis not present

## 2017-10-07 DIAGNOSIS — G43909 Migraine, unspecified, not intractable, without status migrainosus: Secondary | ICD-10-CM | POA: Diagnosis not present

## 2017-10-08 ENCOUNTER — Encounter (HOSPITAL_COMMUNITY): Payer: Self-pay

## 2017-10-08 ENCOUNTER — Encounter (HOSPITAL_COMMUNITY)
Admission: RE | Admit: 2017-10-08 | Discharge: 2017-10-08 | Disposition: A | Discharge status: Home / Self Care | Payer: BLUE CROSS/BLUE SHIELD | Source: Ambulatory Visit | Attending: Urology | Admitting: Urology

## 2017-10-08 ENCOUNTER — Other Ambulatory Visit: Payer: Self-pay

## 2017-10-08 ENCOUNTER — Encounter (INDEPENDENT_AMBULATORY_CARE_PROVIDER_SITE_OTHER): Payer: Self-pay

## 2017-10-08 DIAGNOSIS — N200 Calculus of kidney: Secondary | ICD-10-CM | POA: Diagnosis present

## 2017-10-08 DIAGNOSIS — Z87442 Personal history of urinary calculi: Secondary | ICD-10-CM | POA: Diagnosis not present

## 2017-10-08 DIAGNOSIS — R8271 Bacteriuria: Secondary | ICD-10-CM | POA: Diagnosis not present

## 2017-10-08 DIAGNOSIS — Z823 Family history of stroke: Secondary | ICD-10-CM | POA: Diagnosis not present

## 2017-10-08 DIAGNOSIS — Z9109 Other allergy status, other than to drugs and biological substances: Secondary | ICD-10-CM | POA: Diagnosis not present

## 2017-10-08 DIAGNOSIS — Z8249 Family history of ischemic heart disease and other diseases of the circulatory system: Secondary | ICD-10-CM | POA: Diagnosis not present

## 2017-10-08 DIAGNOSIS — Z91048 Other nonmedicinal substance allergy status: Secondary | ICD-10-CM | POA: Diagnosis not present

## 2017-10-08 DIAGNOSIS — Z79899 Other long term (current) drug therapy: Secondary | ICD-10-CM | POA: Diagnosis not present

## 2017-10-08 DIAGNOSIS — Z803 Family history of malignant neoplasm of breast: Secondary | ICD-10-CM | POA: Diagnosis not present

## 2017-10-08 DIAGNOSIS — K219 Gastro-esophageal reflux disease without esophagitis: Secondary | ICD-10-CM | POA: Diagnosis not present

## 2017-10-08 DIAGNOSIS — Z809 Family history of malignant neoplasm, unspecified: Secondary | ICD-10-CM | POA: Diagnosis not present

## 2017-10-08 DIAGNOSIS — Z885 Allergy status to narcotic agent status: Secondary | ICD-10-CM | POA: Diagnosis not present

## 2017-10-08 DIAGNOSIS — Z8269 Family history of other diseases of the musculoskeletal system and connective tissue: Secondary | ICD-10-CM | POA: Diagnosis not present

## 2017-10-08 DIAGNOSIS — Z811 Family history of alcohol abuse and dependence: Secondary | ICD-10-CM | POA: Diagnosis not present

## 2017-10-08 DIAGNOSIS — N132 Hydronephrosis with renal and ureteral calculous obstruction: Secondary | ICD-10-CM | POA: Diagnosis not present

## 2017-10-08 DIAGNOSIS — R002 Palpitations: Secondary | ICD-10-CM | POA: Diagnosis not present

## 2017-10-08 DIAGNOSIS — F329 Major depressive disorder, single episode, unspecified: Secondary | ICD-10-CM | POA: Diagnosis not present

## 2017-10-08 DIAGNOSIS — G43909 Migraine, unspecified, not intractable, without status migrainosus: Secondary | ICD-10-CM | POA: Diagnosis not present

## 2017-10-08 DIAGNOSIS — I10 Essential (primary) hypertension: Secondary | ICD-10-CM | POA: Diagnosis not present

## 2017-10-08 HISTORY — DX: Essential (primary) hypertension: I10

## 2017-10-08 HISTORY — DX: Gastro-esophageal reflux disease without esophagitis: K21.9

## 2017-10-08 HISTORY — DX: Personal history of urinary calculi: Z87.442

## 2017-10-08 LAB — BASIC METABOLIC PANEL
Anion gap: 9 (ref 5–15)
BUN: 13 mg/dL (ref 6–20)
CALCIUM: 9.2 mg/dL (ref 8.9–10.3)
CO2: 26 mmol/L (ref 22–32)
CREATININE: 0.54 mg/dL (ref 0.44–1.00)
Chloride: 101 mmol/L (ref 101–111)
GFR calc Af Amer: >60 mL/min (ref >60)
GFR calc non Af Amer: >60 mL/min (ref >60)
GLUCOSE: 102 mg/dL — AB (ref 65–99)
Potassium: 3.8 mmol/L (ref 3.5–5.1)
Sodium: 136 mmol/L (ref 135–145)

## 2017-10-08 LAB — CBC
HCT: 38 % (ref 36.0–46.0)
Hemoglobin: 12.4 g/dL (ref 12.0–15.0)
MCH: 25.7 pg — AB (ref 26.0–34.0)
MCHC: 32.6 g/dL (ref 30.0–36.0)
MCV: 78.8 fL (ref 78.0–100.0)
PLATELETS: 319 10*3/uL (ref 150–400)
RBC: 4.82 MIL/uL (ref 3.87–5.11)
RDW: 14 % (ref 11.5–15.5)
WBC: 5.6 10*3/uL (ref 4.0–10.5)

## 2017-10-08 NOTE — Pre-Procedure Instructions (Signed)
CBC and BMP faxed to Dr. Manny via epic. 

## 2017-10-09 MED ORDER — GENTAMICIN SULFATE 40 MG/ML IJ SOLN
5.0000 mg/kg | INTRAVENOUS | Status: AC
  Administered 2017-10-10: 340 mg via INTRAVENOUS
  Filled 2017-10-09: qty 8.5

## 2017-10-10 ENCOUNTER — Ambulatory Visit (HOSPITAL_COMMUNITY)
Admission: RE | Admit: 2017-10-10 | Discharge: 2017-10-10 | Disposition: A | Discharge status: Home / Self Care | Payer: BLUE CROSS/BLUE SHIELD | Source: Ambulatory Visit | Attending: Urology | Admitting: Urology

## 2017-10-10 ENCOUNTER — Ambulatory Visit (HOSPITAL_COMMUNITY): Payer: BLUE CROSS/BLUE SHIELD | Admitting: Anesthesiology

## 2017-10-10 ENCOUNTER — Other Ambulatory Visit: Payer: Self-pay

## 2017-10-10 ENCOUNTER — Encounter (HOSPITAL_COMMUNITY)
Admission: RE | Disposition: A | Discharge status: Home / Self Care | Payer: Self-pay | Source: Ambulatory Visit | Attending: Urology

## 2017-10-10 ENCOUNTER — Ambulatory Visit (HOSPITAL_COMMUNITY): Payer: BLUE CROSS/BLUE SHIELD

## 2017-10-10 ENCOUNTER — Encounter (HOSPITAL_COMMUNITY): Payer: Self-pay | Admitting: *Deleted

## 2017-10-10 DIAGNOSIS — G43909 Migraine, unspecified, not intractable, without status migrainosus: Secondary | ICD-10-CM | POA: Diagnosis not present

## 2017-10-10 DIAGNOSIS — Z823 Family history of stroke: Secondary | ICD-10-CM | POA: Diagnosis not present

## 2017-10-10 DIAGNOSIS — Z809 Family history of malignant neoplasm, unspecified: Secondary | ICD-10-CM | POA: Insufficient documentation

## 2017-10-10 DIAGNOSIS — Z811 Family history of alcohol abuse and dependence: Secondary | ICD-10-CM | POA: Insufficient documentation

## 2017-10-10 DIAGNOSIS — F329 Major depressive disorder, single episode, unspecified: Secondary | ICD-10-CM | POA: Insufficient documentation

## 2017-10-10 DIAGNOSIS — K219 Gastro-esophageal reflux disease without esophagitis: Secondary | ICD-10-CM | POA: Diagnosis not present

## 2017-10-10 DIAGNOSIS — Z87442 Personal history of urinary calculi: Secondary | ICD-10-CM | POA: Insufficient documentation

## 2017-10-10 DIAGNOSIS — N132 Hydronephrosis with renal and ureteral calculous obstruction: Secondary | ICD-10-CM | POA: Diagnosis not present

## 2017-10-10 DIAGNOSIS — Z8249 Family history of ischemic heart disease and other diseases of the circulatory system: Secondary | ICD-10-CM | POA: Diagnosis not present

## 2017-10-10 DIAGNOSIS — Z803 Family history of malignant neoplasm of breast: Secondary | ICD-10-CM | POA: Diagnosis not present

## 2017-10-10 DIAGNOSIS — Z9109 Other allergy status, other than to drugs and biological substances: Secondary | ICD-10-CM | POA: Insufficient documentation

## 2017-10-10 DIAGNOSIS — I10 Essential (primary) hypertension: Secondary | ICD-10-CM | POA: Diagnosis not present

## 2017-10-10 DIAGNOSIS — Z91048 Other nonmedicinal substance allergy status: Secondary | ICD-10-CM | POA: Diagnosis not present

## 2017-10-10 DIAGNOSIS — R002 Palpitations: Secondary | ICD-10-CM | POA: Insufficient documentation

## 2017-10-10 DIAGNOSIS — N200 Calculus of kidney: Secondary | ICD-10-CM | POA: Diagnosis not present

## 2017-10-10 DIAGNOSIS — Z8269 Family history of other diseases of the musculoskeletal system and connective tissue: Secondary | ICD-10-CM | POA: Insufficient documentation

## 2017-10-10 DIAGNOSIS — R8271 Bacteriuria: Secondary | ICD-10-CM | POA: Diagnosis not present

## 2017-10-10 DIAGNOSIS — Z885 Allergy status to narcotic agent status: Secondary | ICD-10-CM | POA: Diagnosis not present

## 2017-10-10 DIAGNOSIS — Z79899 Other long term (current) drug therapy: Secondary | ICD-10-CM | POA: Insufficient documentation

## 2017-10-10 HISTORY — PX: CYSTOSCOPY/URETEROSCOPY/HOLMIUM LASER/STENT PLACEMENT: SHX6546

## 2017-10-10 SURGERY — CYSTOSCOPY/URETEROSCOPY/HOLMIUM LASER/STENT PLACEMENT
Anesthesia: General | Laterality: Bilateral

## 2017-10-10 MED ORDER — DEXAMETHASONE SODIUM PHOSPHATE 10 MG/ML IJ SOLN
INTRAMUSCULAR | Status: DC | PRN
  Administered 2017-10-10: 10 mg via INTRAVENOUS

## 2017-10-10 MED ORDER — CEPHALEXIN 500 MG PO CAPS: 500.0000 mg | ORAL_CAPSULE | Freq: Two times a day (BID) | ORAL | 0 refills | Status: DC

## 2017-10-10 MED ORDER — FENTANYL CITRATE (PF) 100 MCG/2ML IJ SOLN
INTRAMUSCULAR | Status: AC
  Filled 2017-10-10: qty 2

## 2017-10-10 MED ORDER — SENNOSIDES-DOCUSATE SODIUM 8.6-50 MG PO TABS: 1.0000 | ORAL_TABLET | Freq: Two times a day (BID) | ORAL | 0 refills | Status: DC

## 2017-10-10 MED ORDER — MIDAZOLAM HCL 2 MG/2ML IJ SOLN
INTRAMUSCULAR | Status: DC | PRN
Start: 2017-10-10 — End: 2017-10-10
  Administered 2017-10-10: 2 mg via INTRAVENOUS

## 2017-10-10 MED ORDER — MIDAZOLAM HCL 2 MG/2ML IJ SOLN
INTRAMUSCULAR | Status: AC
  Filled 2017-10-10: qty 2

## 2017-10-10 MED ORDER — ONDANSETRON HCL 4 MG/2ML IJ SOLN
INTRAMUSCULAR | Status: AC
  Filled 2017-10-10: qty 2

## 2017-10-10 MED ORDER — HYDROMORPHONE HCL 1 MG/ML IJ SOLN: 0.2500 mg | INTRAMUSCULAR | Status: DC | PRN

## 2017-10-10 MED ORDER — ONDANSETRON HCL 4 MG/2ML IJ SOLN
INTRAMUSCULAR | Status: DC | PRN
  Administered 2017-10-10: 4 mg via INTRAVENOUS

## 2017-10-10 MED ORDER — PHENYLEPHRINE 40 MCG/ML (10ML) SYRINGE FOR IV PUSH (FOR BLOOD PRESSURE SUPPORT)
PREFILLED_SYRINGE | INTRAVENOUS | Status: AC
  Filled 2017-10-10: qty 20

## 2017-10-10 MED ORDER — PHENYLEPHRINE 40 MCG/ML (10ML) SYRINGE FOR IV PUSH (FOR BLOOD PRESSURE SUPPORT)
PREFILLED_SYRINGE | INTRAVENOUS | Status: DC | PRN
  Administered 2017-10-10 (×3): 80 ug via INTRAVENOUS
  Administered 2017-10-10: 120 ug via INTRAVENOUS

## 2017-10-10 MED ORDER — LIDOCAINE 2% (20 MG/ML) 5 ML SYRINGE
INTRAMUSCULAR | Status: AC
  Filled 2017-10-10: qty 25

## 2017-10-10 MED ORDER — TRAMADOL HCL 50 MG PO TABS: 50.0000 mg | ORAL_TABLET | Freq: Four times a day (QID) | ORAL | 0 refills | Status: AC | PRN

## 2017-10-10 MED ORDER — FENTANYL CITRATE (PF) 100 MCG/2ML IJ SOLN
INTRAMUSCULAR | Status: DC | PRN
  Administered 2017-10-10 (×3): 50 ug via INTRAVENOUS

## 2017-10-10 MED ORDER — IOHEXOL 300 MG/ML  SOLN
INTRAMUSCULAR | Status: DC | PRN
  Administered 2017-10-10: 30 mL via URETHRAL

## 2017-10-10 MED ORDER — SODIUM CHLORIDE 0.9 % IR SOLN
Status: DC | PRN
  Administered 2017-10-10: 5000 mL via INTRAVESICAL

## 2017-10-10 MED ORDER — PROPOFOL 10 MG/ML IV BOLUS
INTRAVENOUS | Status: AC
  Filled 2017-10-10: qty 20

## 2017-10-10 MED ORDER — KETOROLAC TROMETHAMINE 10 MG PO TABS: 10.0000 mg | ORAL_TABLET | Freq: Four times a day (QID) | ORAL | 1 refills | Status: DC | PRN

## 2017-10-10 MED ORDER — PROPOFOL 10 MG/ML IV BOLUS
INTRAVENOUS | Status: DC | PRN
  Administered 2017-10-10: 200 mg via INTRAVENOUS

## 2017-10-10 MED ORDER — PHENYLEPHRINE 40 MCG/ML (10ML) SYRINGE FOR IV PUSH (FOR BLOOD PRESSURE SUPPORT)
PREFILLED_SYRINGE | INTRAVENOUS | Status: AC
  Filled 2017-10-10: qty 10

## 2017-10-10 MED ORDER — TRAMADOL HCL 50 MG PO TABS
50.0000 mg | ORAL_TABLET | Freq: Once | ORAL | Status: AC
  Administered 2017-10-10: 50 mg via ORAL
  Filled 2017-10-10: qty 1

## 2017-10-10 MED ORDER — DEXAMETHASONE SODIUM PHOSPHATE 10 MG/ML IJ SOLN
INTRAMUSCULAR | Status: AC
  Filled 2017-10-10: qty 1

## 2017-10-10 MED ORDER — LACTATED RINGERS IV SOLN
INTRAVENOUS | Status: DC
  Administered 2017-10-10: 08:00:00 via INTRAVENOUS

## 2017-10-10 MED ORDER — LACTATED RINGERS IV SOLN
INTRAVENOUS | Status: DC | PRN
  Administered 2017-10-10 (×2): via INTRAVENOUS

## 2017-10-10 MED ORDER — PROMETHAZINE HCL 25 MG/ML IJ SOLN: 6.2500 mg | INTRAMUSCULAR | Status: DC | PRN

## 2017-10-10 MED ORDER — LIDOCAINE 2% (20 MG/ML) 5 ML SYRINGE
INTRAMUSCULAR | Status: DC | PRN
  Administered 2017-10-10: 60 mg via INTRAVENOUS

## 2017-10-10 SURGICAL SUPPLY — 25 items
BAG URO CATCHER STRL LF (MISCELLANEOUS) ×2 IMPLANT
BASKET LASER NITINOL 1.9FR (BASKET) ×2 IMPLANT
CATH INTERMIT  6FR 70CM (CATHETERS) ×2 IMPLANT
CLOTH BEACON ORANGE TIMEOUT ST (SAFETY) ×2 IMPLANT
COVER FOOTSWITCH UNIV (MISCELLANEOUS) IMPLANT
COVER SURGICAL LIGHT HANDLE (MISCELLANEOUS) IMPLANT
FIBER LASER FLEXIVA 1000 (UROLOGICAL SUPPLIES) IMPLANT
FIBER LASER FLEXIVA 365 (UROLOGICAL SUPPLIES) IMPLANT
FIBER LASER FLEXIVA 550 (UROLOGICAL SUPPLIES) IMPLANT
FIBER LASER TRAC TIP (UROLOGICAL SUPPLIES) ×2 IMPLANT
GLOVE BIOGEL M STRL SZ7.5 (GLOVE) ×2 IMPLANT
GOWN STRL REUS W/TWL LRG LVL3 (GOWN DISPOSABLE) ×2 IMPLANT
GUIDEWIRE ANG ZIPWIRE 038X150 (WIRE) ×4 IMPLANT
GUIDEWIRE STR DUAL SENSOR (WIRE) ×4 IMPLANT
IV NS 1000ML (IV SOLUTION) ×1
IV NS 1000ML BAXH (IV SOLUTION) ×1 IMPLANT
MANIFOLD NEPTUNE II (INSTRUMENTS) ×2 IMPLANT
PACK CYSTO (CUSTOM PROCEDURE TRAY) ×2 IMPLANT
SHEATH ACCESS URETERAL 24CM (SHEATH) ×2 IMPLANT
SHEATH ACCESS URETERAL 54CM (SHEATH) IMPLANT
SHEATH URETERAL 12FRX35CM (MISCELLANEOUS) IMPLANT
STENT POLARIS 5FRX24 (STENTS) ×4 IMPLANT
SYR CONTROL 10ML LL (SYRINGE) IMPLANT
TUBE FEEDING 8FR 16IN STR KANG (MISCELLANEOUS) ×2 IMPLANT
TUBING CONNECTING 10 (TUBING) ×2 IMPLANT

## 2017-10-10 NOTE — Anesthesia Procedure Notes (Signed)
Procedure Name: LMA Insertion Date/Time: 10/10/2017 9:45 AM Performed by: Minerva EndsMirarchi, Oriel Rumbold M, CRNA Pre-anesthesia Checklist: Patient identified, Emergency Drugs available, Suction available and Patient being monitored Patient Re-evaluated:Patient Re-evaluated prior to induction Oxygen Delivery Method: Circle System Utilized Preoxygenation: Pre-oxygenation with 100% oxygen Induction Type: IV induction Ventilation: Mask ventilation without difficulty LMA: LMA inserted LMA Size: 4.0 Tube type: 20 cc air. Number of attempts: 1 Airway Equipment and Method: Bite block Placement Confirmation: positive ETCO2 Tube secured with: Tape Dental Injury: Teeth and Oropharynx as per pre-operative assessment  Comments: Smooth IV induction Ellender-- LMA insertin AM CRNA--- atraumatic------ teeth and mouth as preop--- front teeth bonded on bottom as per pt report preop--- bilat BS Ellender

## 2017-10-10 NOTE — H&P (Signed)
Rebecca Armstrong is an 36 y.o. female.    Chief Complaint: Pre-Op BILATERAL Ureteroscopic Stone Manipulation  HPI:   1 - Left Ureteral Stone - 3.23m left distal stone at iliac crossing by CT 08/19/09 after delivery of baby by Cesarean on eval left flank pain. Also bilateral intra-renal stones about 82mtotal voluem each side. Minimal hydro. Cr 0.53. No prior colic.   2 - Bacteruria - bacteruria noted on intake UA at time of csection 08/2017. No fevers, No leukocytosis. Final UCX non-clonal, FU UCX negative.   PMH sig for caesarian, now tubal. No CV disease / blood thinners.   Today "[place] seen to proceed with bilateral ureteroscopic stone manipulation with goal of stone free in elective setting. No interval fevers. Most recent UCX negative.    Past Medical History:  Diagnosis Date  . Depression    celexa '20mg'$ , restarted after twins were born and stayed on it.  . Marland Kitchenysrhythmia    history of palpitations-EKG and workup in 04/2011--NSR with PAC's-avoid stimulants  . GERD (gastroesophageal reflux disease)   . History of kidney stones   . Hypertension   . Migraine   . Newborn product of IVF pregnancy     Past Surgical History:  Procedure Laterality Date  . CESAREAN SECTION  09/01/2012   Procedure: CESAREAN SECTION;  Surgeon: KaLogan BoresMD;  [facility];  Service: Obstetrics;  Laterality: N/A;  Primay c/s-TWINS  . CESAREAN SECTION WITH BILATERAL TUBAL LIGATION Bilateral 08/19/2017   Procedure: CESAREAN SECTION WITH BILATERAL TUBAL LIGATION;  Surgeon: RiPaula ComptonMD;  [facility]: [facility];  Laterality: Bilateral;  Tracey RNFA  . egg retrieval  2013   IVF-Propofol  . MOLE REMOVAL      Family History  Problem Relation Age of Onset  . Alcohol abuse Mother   . Miscarriages / StKoreaother   . Fibromyalgia Mother   . Hypertension Mother   . Melanoma Father   . Hypertension Maternal Grandmother   . Multiple myeloma Maternal  Grandfather   . Breast cancer Paternal Grandmother   . Depression Paternal Grandmother   . Heart attack Paternal Grandfather [id number]. Transient ischemic attack Unknown        MG aunt  . Sudden death Paternal AuAunt 7. Hypertension Unknown        MGGM   Social History:  reports that  has never smoked. she has never used smokeless tobacco. She reports that she drinks alcohol. She reports that she does not use drugs.  Allergies:  Allergies  Allergen Reactions  . Dilaudid [Hydromorphone Hcl] Nausea And Vomiting  . Adhesive [Tape] Itching and Rash  . Codeine Nausea And Vomiting    Medications Prior to Admission  Medication Sig Dispense Refill  . acetaminophen (TYLENOL) 500 MG tablet Take 1,000 mg by mouth every 6 (six) hours as needed for mild pain.    . B Complex-C (SUPER B COMPLEX PO) Take 1 tablet 3 (three) times a week by mouth.     . citalopram (CELEXA) 20 MG tablet Take 20 mg by mouth daily.     . fluticasone (FLONASE) 50 MCG/ACT nasal spray Place 1 spray daily as needed into both nostrils for allergies or rhinitis.    . Marland Kitchenbuprofen (ADVIL,MOTRIN) 200 MG tablet Take 400-600 mg every 6 (six) hours as needed by mouth for headache or moderate pain.    . Marland Kitchenabetalol (NORMODYNE) 200 MG tablet Take 200 mg 2 (two) times daily by mouth.  Results for orders placed or performed during the hospital encounter of 10/08/17 (from the past 48 hour(s))  Basic metabolic panel     Status: Abnormal   Collection Time: 10/08/17 11:34 AM  Result Value Ref Range   Sodium 136 135 - 145 mmol/L   Potassium 3.8 3.5 - 5.1 mmol/L   Chloride 101 101 - 111 mmol/L   CO2 26 22 - 32 mmol/L   Glucose, Bld 102 (H) 65 - 99 mg/dL   BUN 13 6 - 20 mg/dL   Creatinine, Ser 0.54 0.44 - 1.00 mg/dL   Calcium 9.2 8.9 - 10.3 mg/dL   GFR calc non Af Amer >60 >60 mL/min   GFR calc Af Amer >60 >60 mL/min    Comment: (NOTE) The eGFR has been calculated using the CKD EPI equation. This calculation has not been validated in  all clinical situations. eGFR's persistently <60 mL/min signify possible Chronic Kidney Disease.    Anion gap 9 5 - 15  CBC     Status: Abnormal   Collection Time: 10/08/17 11:34 AM  Result Value Ref Range   WBC 5.6 4.0 - 10.5 K/uL   RBC 4.82 3.87 - 5.11 MIL/uL   Hemoglobin 12.4 12.0 - 15.0 g/dL   HCT 38.0 36.0 - 46.0 %   MCV 78.8 78.0 - 100.0 fL   MCH 25.7 (L) 26.0 - 34.0 pg   MCHC 32.6 30.0 - 36.0 g/dL   RDW 14.0 11.5 - 15.5 %   Platelets 319 150 - 400 K/uL   No results found.  Review of Systems  Constitutional: Negative.  Negative for chills and fever.  HENT: Negative.   Eyes: Negative.   Respiratory: Negative.   Cardiovascular: Negative.   Gastrointestinal: Negative.   Genitourinary: Negative.   Musculoskeletal: Negative.   Skin: Negative.   Neurological: Negative.   Endo/Heme/Allergies: Negative.   Psychiatric/Behavioral: Negative.     Blood pressure (!) 131/96, pulse 78, temperature 97.9 F (36.6 C), temperature source Oral, resp. rate 18, height '5\' 4"'$  (1.626 m), weight 86.2 kg (190 lb), SpO2 98 %, unknown if currently breastfeeding. Physical Exam  Constitutional: She appears well-developed.  HENT:  Head: Normocephalic.  Eyes: Pupils are equal, round, and reactive to light.  Neck: Normal range of motion.  Cardiovascular: Normal rate.  Respiratory: Effort normal.  GI: Soft.  Genitourinary:  Genitourinary Comments: No CVAT at present.  Musculoskeletal: Normal range of motion.  Neurological: She is alert.  Skin: Skin is warm.  Psychiatric: She has a normal mood and affect.     Assessment/Plan  Proceed as planned with BILATERAL Ureteroscopic Stone Manipulation. Risks, benefits, alternatives, expected peri-op course including need for at least temporary post-op stents discussed previously and reiterated today.   [name], MD 10/10/2017, 9:05 AM

## 2017-10-10 NOTE — Transfer of Care (Signed)
Immediate Anesthesia Transfer of Care Note  Patient: Rebecca Armstrong  Procedure(s) Performed: CYSTOSCOPY/URETEROSCOPY/RETROGRADE PYELOGRAM/HOLMIUM LASER/STENT PLACEMENT (Bilateral )  Patient Location: PACU  Anesthesia Type:General  Level of Consciousness: awake and alert   Airway & Oxygen Therapy: Patient Spontanous Breathing and Patient connected to face mask oxygen  Post-op Assessment: Report given to RN and Post -op Vital signs reviewed and stable  Post vital signs: Reviewed and stable  Last Vitals:  Vitals:   10/10/17 0731  BP: (!) 131/96  Pulse: 78  Resp: 18  Temp: 36.6 C  SpO2: 98%    Last Pain:  Vitals:   10/10/17 0731  TempSrc: Oral      Patients Stated Pain Goal: 3 (10/10/17 0731)  Complications: No apparent anesthesia complications

## 2017-10-10 NOTE — Brief Op Note (Signed)
10/10/2017  10:44 AM  PATIENT:  Nelva NayAshleigh Puzio  36 y.o. female  PRE-OPERATIVE DIAGNOSIS:  BILATERAL RENAL STONES  POST-OPERATIVE DIAGNOSIS:  BILATERAL RENAL STONES  PROCEDURE:  Procedure(s): CYSTOSCOPY/URETEROSCOPY/RETROGRADE PYELOGRAM/HOLMIUM LASER/STENT PLACEMENT (Bilateral)  SURGEON:  Surgeon(s) and Role:    Sebastian Ache* Javeion Cannedy, MD - Primary  PHYSICIAN ASSISTANT:   ASSISTANTS: none   ANESTHESIA:   general  EBL:  minimal   BLOOD ADMINISTERED:none  DRAINS: none   LOCAL MEDICATIONS USED:  NONE  SPECIMEN:  Source of Specimen:  bilateral renal stone fragments  DISPOSITION OF SPECIMEN:  [facility] for compositional analysis  COUNTS:  YES  TOURNIQUET:  * No tourniquets in log *  DICTATION: .Other Dictation: Dictation Number [id number]  PLAN OF CARE: Discharge to home after PACU  PATIENT DISPOSITION:  PACU - hemodynamically stable.   Delay start of Pharmacological VTE agent (>24hrs) due to surgical blood loss or risk of bleeding: yes

## 2017-10-10 NOTE — Anesthesia Preprocedure Evaluation (Addendum)
Anesthesia Evaluation  Patient identified by MRN, date of birth, ID band Patient awake    Reviewed: Allergy & Precautions, NPO status , Patient's Chart, lab work & pertinent test results, reviewed documented beta blocker date and time   Airway Mallampati: III  TM Distance: >3 FB Neck ROM: Full    Dental no notable dental hx. (+) Dental Advisory Given Front teeth bonded:   Pulmonary neg pulmonary ROS,    Pulmonary exam normal breath sounds clear to auscultation       Cardiovascular hypertension, Pt. on home beta blockers and Pt. on medications Normal cardiovascular exam+ dysrhythmias  Rhythm:Regular Rate:Normal  ECG: NSR, rate 68   Neuro/Psych  Headaches, PSYCHIATRIC DISORDERS Depression    GI/Hepatic Neg liver ROS, GERD  Controlled,  Endo/Other  negative endocrine ROS  Renal/GU negative Renal ROS     Musculoskeletal negative musculoskeletal ROS (+)   Abdominal (+) + obese,   Peds  Hematology negative hematology ROS (+)   Anesthesia Other Findings  BILATERAL RENAL STONES  Reproductive/Obstetrics S/p BTL                           Anesthesia Physical Anesthesia Plan  ASA: II  Anesthesia Plan: General   Post-op Pain Management:    Induction: Intravenous  PONV Risk Score and Plan: 3 and Dexamethasone, Ondansetron and Midazolam  Airway Management Planned: LMA  Additional Equipment:   Intra-op Plan:   Post-operative Plan: Extubation in OR  Informed Consent: I have reviewed the patients History and Physical, chart, labs and discussed the procedure including the risks, benefits and alternatives for the proposed anesthesia with the patient or authorized representative who has indicated his/her understanding and acceptance.   Dental advisory given  Plan Discussed with: CRNA  Anesthesia Plan Comments:         Anesthesia Quick Evaluation

## 2017-10-10 NOTE — Discharge Instructions (Signed)
1 - You may have urinary urgency (bladder spasms) and bloody urine on / off with stent in place. This is normal.  2 - Remove tethered stents on Monday morning at home by pulling on string, then blue-white plastic tubing, and discarding. There are TWO stents. Office is open Monday if any problems arise.   3 - Call MD or go to ER for fever >102, severe pain / nausea / vomiting not relieved by medications, or acute change in medical status

## 2017-10-10 NOTE — Anesthesia Procedure Notes (Signed)
Date/Time: 10/10/2017 10:46 AM Performed by: Minerva EndsMirarchi, Demondre Aguas M, CRNA Pre-anesthesia Checklist: Emergency Drugs available, Suction available and Patient being monitored Oxygen Delivery Method: Simple face mask Comments: Extubated to face mask

## 2017-10-13 ENCOUNTER — Encounter (HOSPITAL_COMMUNITY): Payer: Self-pay | Admitting: Urology

## 2017-10-13 DIAGNOSIS — N202 Calculus of kidney with calculus of ureter: Secondary | ICD-10-CM | POA: Diagnosis not present

## 2017-10-13 NOTE — Anesthesia Postprocedure Evaluation (Signed)
Anesthesia Post Note  Patient: Rebecca Armstrong  Procedure(s) Performed: CYSTOSCOPY/URETEROSCOPY/RETROGRADE PYELOGRAM/HOLMIUM LASER/STENT PLACEMENT (Bilateral )     Patient location during evaluation: PACU Anesthesia Type: General Level of consciousness: awake and alert Pain management: pain level controlled Vital Signs Assessment: post-procedure vital signs reviewed and stable Respiratory status: spontaneous breathing, nonlabored ventilation, respiratory function stable and patient connected to nasal cannula oxygen Cardiovascular status: blood pressure returned to baseline and stable Postop Assessment: no apparent nausea or vomiting Anesthetic complications: no    Last Vitals:  Vitals:   10/10/17 1159 10/10/17 1315  BP: 129/82 127/77  Pulse: 70 75  Resp: 12 14  Temp: 36.5 C 36.8 C  SpO2: 96% 96%    Last Pain:  Vitals:   10/13/17 1337  TempSrc:   PainSc: 4                  Ory Elting P Zeniah Briney

## 2017-10-13 NOTE — Op Note (Signed)
NAMNelva Nay:  Wolfley, Angelin               ACCOUNT NO.:  [id number]  MEDICAL RECORD NO.:  [id number]  LOCATION:                                 FACILITY:  PHYSICIAN:  Sebastian Acheheodore Ashad Fawbush, MD          DATE OF BIRTH:  DATE OF PROCEDURE:  10/10/2017                              OPERATIVE REPORT   DIAGNOSIS:  Bilateral renal stones, history of left renal colic.  PROCEDURES: 1. Cystoscopy with bilateral retrograde pyelograms and interpretation. 2. Bilateral ureteroscopy with laser lithotripsy. 3. Insertion of bilateral stents, 5 x 26 Polaris with tether.  ESTIMATED BLOOD LOSS:  Nil.  COMPLICATION:  None.  SPECIMEN:  Bilateral renal stone fragments for compositional analysis.  FINDINGS: 1. Interval passage of left ureteral stone. 2. Bilateral multifocal papillary tip calcifications. 3. Complete resolution of all stone fragments larger than 1/3rd mm     following laser lithotripsy and basket extraction, bilaterally. 4. Successful placement of bilateral ureteral stents, proximal in the     renal pelvis and distal in the bladder.  INDICATION:  Ms. Burnetta Sabinvey is a very pleasant 36 year old woman who was found on workup of left flank pain around the time of her most recent childbirth to have a left midureteral stone with hydronephrosis and bilateral renal stones.  She has two additional young children as well. Options were discussed for further management including observation with continued medical therapy versus ureteroscopy, unilateral versus bilateral, and she adamantly wished to proceed with bilateral ureteroscopy with goal of stone free to avoid experiencing colic at an inopportune, nonelective time.  Informed consent was obtained and placed in the medical record.  PROCEDURE IN DETAIL:  The patient being, Emeline Generalshley Chewning, was verified. Procedure being bilateral ureteroscopic stone manipulation was confirmed.  Procedure was carried out.  Time-out was performed. Intravenous antibiotics were  administered.  General LMA anesthesia was introduced.  The patient was placed into a low lithotomy position. Sterile field was created by prepping and draping the patient's vagina, introitus and proximal thighs using iodine.  Next, cystourethroscopy was performed using a rigid cystoscope with offset lens.  Inspection of the urinary bladder revealed no diverticula, calcifications, papillary lesions.  The ureteral orifices were somewhat lateralized, but not pathologically so.  The right ureteral orifice was cannulated with a 6- French end-hole catheter and right retrograde pyelogram was obtained.  Right retrograde pyelogram demonstrated a single right ureter with single-system right kidney.  No filling defects or narrowing noted.  A 0.038 Zip wire was advanced to the level of the upper pole, set aside as a safety wire.  Similarly, left retrograde pyelogram was obtained.  Left retrograde pyelogram demonstrated a single left ureter with single- system left kidney.  No filling defects or narrowing noted.  There were some phleboliths seen near the course of the ureter, but did not correspond a filling defect.  A separate Zip wire was advanced to the level of the upper pole, set aside as a safety wire.  An 8-French feeding tube was then placed in the urinary bladder for pressure release.  Next, semi-rigid ureteroscopy was performed of the distal four- fifths of the left ureter alongside a separate Sensor working wire, no  mucosal abnormalities were found.  Similarly, semi-rigid ureteroscopy was performed of the distal four-fifths of the right ureter alongside a separate Sensor working wire, no mucosal abnormalities were found.  The semi-rigid scope was then exchanged for 12/14, 24-cm ureteral access sheath over the Sensor working wire on the right side at the level of the proximal ureter.  Using continuous fluoroscopic guidance, flexible digital ureteroscopy was performed of the proximal ureter  and systematic inspection of the right kidney including all calices x3.  Multifocal small-volume papillary tip calcifications were noted.  There was one dominant foci in the upper pole that was much too large for simple basketing.  As such, holmium laser energy was applied to the stone using settings of 0.3 joules and 20 hertz.  This dominant fragment was fractured into approximately three smaller pieces.  These were then sequentially removed with Escape basket, set aside for compositional analysis.  Additional papillary tip calcifications were removed with the basket, were ablated via the laser.  Such that, all stones had been addressed and all stone fragments larger than 1/3rd mm had been removed. The access sheath was removed under continuous vision, no significant mucosal abnormalities were found.  Similarly, the access sheath was then placed over the left side of Sensor working wire at the level of the proximal left ureter using continuous fluoroscopic guidance.  Then, systematic inspection was performed of the left proximal ureter and left kidney using flexible digital ureteroscope.  Multifocal papillary tip calcifications were encountered on the left side as well.  Several foci including mid pole and lower pole for too large for simple basketing. Holmium laser energy was applied to these stones with similar settings. An Escape basket was then used to remove all stone fragments, such that all fragments larger than 1/3rd mm were removed.  This access sheath was removed under continuous vision, no significant mucosal abnormalities were found.  Given the bilateral nature of the procedure, it was felt that brief interval stenting would be warranted.  As such, a new 5 x 26 Polaris-type stent was placed over the remaining safety wire bilaterally using fluoroscopic guidance.  Good proximal and distal deployment were noted.  The tethers were left in place and fashioned to the mons pubis and  procedure was terminated.  The patient tolerated the procedure well. There were no immediate periprocedural complications.  The patient was taken to the postanesthesia care unit in stable condition.    ______________________________ Sebastian Acheheodore Julita Ozbun, MD   ______________________________ Sebastian Acheheodore Minette Manders, MD    TM/MEDQ  D:  10/10/2017  T:  10/11/2017  Job:  [id number]

## 2017-10-27 DIAGNOSIS — Z0000 Encounter for general adult medical examination without abnormal findings: Secondary | ICD-10-CM | POA: Diagnosis not present

## 2017-10-28 DIAGNOSIS — N202 Calculus of kidney with calculus of ureter: Secondary | ICD-10-CM | POA: Diagnosis not present

## 2017-10-30 DIAGNOSIS — E7849 Other hyperlipidemia: Secondary | ICD-10-CM | POA: Diagnosis not present

## 2017-10-30 DIAGNOSIS — Z0000 Encounter for general adult medical examination without abnormal findings: Secondary | ICD-10-CM | POA: Diagnosis not present

## 2017-10-30 DIAGNOSIS — Z1389 Encounter for screening for other disorder: Secondary | ICD-10-CM | POA: Diagnosis not present

## 2017-10-30 DIAGNOSIS — I10 Essential (primary) hypertension: Secondary | ICD-10-CM | POA: Diagnosis not present

## 2017-10-30 DIAGNOSIS — L308 Other specified dermatitis: Secondary | ICD-10-CM | POA: Diagnosis not present

## 2017-10-30 DIAGNOSIS — R51 Headache: Secondary | ICD-10-CM | POA: Diagnosis not present

## 2017-12-31 DIAGNOSIS — Z319 Encounter for procreative management, unspecified: Secondary | ICD-10-CM | POA: Diagnosis not present

## 2018-01-12 DIAGNOSIS — L218 Other seborrheic dermatitis: Secondary | ICD-10-CM | POA: Diagnosis not present

## 2018-04-13 DIAGNOSIS — Z713 Dietary counseling and surveillance: Secondary | ICD-10-CM | POA: Diagnosis not present

## 2018-05-26 DIAGNOSIS — Z713 Dietary counseling and surveillance: Secondary | ICD-10-CM | POA: Diagnosis not present

## 2018-06-15 DIAGNOSIS — F4323 Adjustment disorder with mixed anxiety and depressed mood: Secondary | ICD-10-CM | POA: Diagnosis not present

## 2018-06-23 DIAGNOSIS — Z713 Dietary counseling and surveillance: Secondary | ICD-10-CM | POA: Diagnosis not present

## 2018-06-25 DIAGNOSIS — F4323 Adjustment disorder with mixed anxiety and depressed mood: Secondary | ICD-10-CM | POA: Diagnosis not present

## 2018-07-09 DIAGNOSIS — F4323 Adjustment disorder with mixed anxiety and depressed mood: Secondary | ICD-10-CM | POA: Diagnosis not present

## 2018-07-28 DIAGNOSIS — F4323 Adjustment disorder with mixed anxiety and depressed mood: Secondary | ICD-10-CM | POA: Diagnosis not present

## 2018-10-13 DIAGNOSIS — Z130 Encounter for screening for diseases of the blood and blood-forming organs and certain disorders involving the immune mechanism: Secondary | ICD-10-CM | POA: Diagnosis not present

## 2018-10-13 DIAGNOSIS — Z01419 Encounter for gynecological examination (general) (routine) without abnormal findings: Secondary | ICD-10-CM | POA: Diagnosis not present

## 2018-10-13 DIAGNOSIS — Z23 Encounter for immunization: Secondary | ICD-10-CM | POA: Diagnosis not present

## 2018-10-13 DIAGNOSIS — Z6833 Body mass index (BMI) 33.0-33.9, adult: Secondary | ICD-10-CM | POA: Diagnosis not present

## 2018-10-13 DIAGNOSIS — Z124 Encounter for screening for malignant neoplasm of cervix: Secondary | ICD-10-CM | POA: Diagnosis not present

## 2018-10-14 DIAGNOSIS — Z124 Encounter for screening for malignant neoplasm of cervix: Secondary | ICD-10-CM | POA: Diagnosis not present

## 2018-10-14 DIAGNOSIS — R87610 Atypical squamous cells of undetermined significance on cytologic smear of cervix (ASC-US): Secondary | ICD-10-CM | POA: Diagnosis not present

## 2018-11-24 DIAGNOSIS — R82998 Other abnormal findings in urine: Secondary | ICD-10-CM | POA: Diagnosis not present

## 2018-11-24 DIAGNOSIS — I10 Essential (primary) hypertension: Secondary | ICD-10-CM | POA: Diagnosis not present

## 2018-11-24 DIAGNOSIS — Z0000 Encounter for general adult medical examination without abnormal findings: Secondary | ICD-10-CM | POA: Diagnosis not present

## 2018-12-11 DIAGNOSIS — I10 Essential (primary) hypertension: Secondary | ICD-10-CM | POA: Diagnosis not present

## 2018-12-11 DIAGNOSIS — Z1331 Encounter for screening for depression: Secondary | ICD-10-CM | POA: Diagnosis not present

## 2018-12-11 DIAGNOSIS — Z0000 Encounter for general adult medical examination without abnormal findings: Secondary | ICD-10-CM | POA: Diagnosis not present

## 2018-12-11 DIAGNOSIS — R51 Headache: Secondary | ICD-10-CM | POA: Diagnosis not present

## 2018-12-11 DIAGNOSIS — Z87442 Personal history of urinary calculi: Secondary | ICD-10-CM | POA: Diagnosis not present

## 2018-12-11 DIAGNOSIS — E668 Other obesity: Secondary | ICD-10-CM | POA: Diagnosis not present

## 2019-06-16 ENCOUNTER — Other Ambulatory Visit: Payer: Self-pay

## 2019-06-16 DIAGNOSIS — Z20822 Contact with and (suspected) exposure to covid-19: Secondary | ICD-10-CM

## 2019-06-17 DIAGNOSIS — I10 Essential (primary) hypertension: Secondary | ICD-10-CM | POA: Diagnosis not present

## 2019-06-17 DIAGNOSIS — F330 Major depressive disorder, recurrent, mild: Secondary | ICD-10-CM | POA: Diagnosis not present

## 2019-06-17 DIAGNOSIS — E785 Hyperlipidemia, unspecified: Secondary | ICD-10-CM | POA: Diagnosis not present

## 2019-06-17 DIAGNOSIS — E669 Obesity, unspecified: Secondary | ICD-10-CM | POA: Diagnosis not present

## 2019-06-17 LAB — NOVEL CORONAVIRUS, NAA: SARS-CoV-2, NAA: NOT DETECTED

## 2019-06-24 DIAGNOSIS — N920 Excessive and frequent menstruation with regular cycle: Secondary | ICD-10-CM | POA: Diagnosis not present

## 2019-06-24 DIAGNOSIS — N943 Premenstrual tension syndrome: Secondary | ICD-10-CM | POA: Diagnosis not present

## 2019-06-30 DIAGNOSIS — D1801 Hemangioma of skin and subcutaneous tissue: Secondary | ICD-10-CM | POA: Diagnosis not present

## 2019-06-30 DIAGNOSIS — D225 Melanocytic nevi of trunk: Secondary | ICD-10-CM | POA: Diagnosis not present

## 2019-06-30 DIAGNOSIS — L821 Other seborrheic keratosis: Secondary | ICD-10-CM | POA: Diagnosis not present

## 2019-06-30 DIAGNOSIS — R202 Paresthesia of skin: Secondary | ICD-10-CM | POA: Diagnosis not present

## 2019-07-15 ENCOUNTER — Other Ambulatory Visit: Payer: Self-pay

## 2019-07-15 DIAGNOSIS — R6889 Other general symptoms and signs: Secondary | ICD-10-CM | POA: Diagnosis not present

## 2019-07-15 DIAGNOSIS — Z20822 Contact with and (suspected) exposure to covid-19: Secondary | ICD-10-CM

## 2019-07-16 LAB — NOVEL CORONAVIRUS, NAA: SARS-CoV-2, NAA: NOT DETECTED

## 2019-12-06 ENCOUNTER — Ambulatory Visit: Payer: BLUE CROSS/BLUE SHIELD | Attending: Internal Medicine

## 2019-12-06 DIAGNOSIS — Z20822 Contact with and (suspected) exposure to covid-19: Secondary | ICD-10-CM

## 2019-12-07 LAB — NOVEL CORONAVIRUS, NAA: SARS-CoV-2, NAA: NOT DETECTED

## 2019-12-15 DIAGNOSIS — I10 Essential (primary) hypertension: Secondary | ICD-10-CM | POA: Diagnosis not present

## 2019-12-15 DIAGNOSIS — E7849 Other hyperlipidemia: Secondary | ICD-10-CM | POA: Diagnosis not present

## 2019-12-15 DIAGNOSIS — R82998 Other abnormal findings in urine: Secondary | ICD-10-CM | POA: Diagnosis not present

## 2019-12-15 DIAGNOSIS — Z0000 Encounter for general adult medical examination without abnormal findings: Secondary | ICD-10-CM | POA: Diagnosis not present

## 2019-12-20 DIAGNOSIS — F330 Major depressive disorder, recurrent, mild: Secondary | ICD-10-CM | POA: Diagnosis not present

## 2019-12-20 DIAGNOSIS — Z1331 Encounter for screening for depression: Secondary | ICD-10-CM | POA: Diagnosis not present

## 2019-12-20 DIAGNOSIS — G43909 Migraine, unspecified, not intractable, without status migrainosus: Secondary | ICD-10-CM | POA: Diagnosis not present

## 2019-12-20 DIAGNOSIS — I10 Essential (primary) hypertension: Secondary | ICD-10-CM | POA: Diagnosis not present

## 2019-12-20 DIAGNOSIS — Z0000 Encounter for general adult medical examination without abnormal findings: Secondary | ICD-10-CM | POA: Diagnosis not present

## 2019-12-20 DIAGNOSIS — Z808 Family history of malignant neoplasm of other organs or systems: Secondary | ICD-10-CM | POA: Diagnosis not present

## 2020-01-24 DIAGNOSIS — S93491A Sprain of other ligament of right ankle, initial encounter: Secondary | ICD-10-CM | POA: Diagnosis not present

## 2020-03-10 DIAGNOSIS — F900 Attention-deficit hyperactivity disorder, predominantly inattentive type: Secondary | ICD-10-CM | POA: Diagnosis not present

## 2020-03-23 DIAGNOSIS — F900 Attention-deficit hyperactivity disorder, predominantly inattentive type: Secondary | ICD-10-CM | POA: Diagnosis not present

## 2020-05-03 DIAGNOSIS — F909 Attention-deficit hyperactivity disorder, unspecified type: Secondary | ICD-10-CM | POA: Diagnosis not present

## 2020-06-12 DIAGNOSIS — Z20822 Contact with and (suspected) exposure to covid-19: Secondary | ICD-10-CM | POA: Diagnosis not present

## 2020-07-11 DIAGNOSIS — L814 Other melanin hyperpigmentation: Secondary | ICD-10-CM | POA: Diagnosis not present

## 2020-07-11 DIAGNOSIS — L821 Other seborrheic keratosis: Secondary | ICD-10-CM | POA: Diagnosis not present

## 2020-07-11 DIAGNOSIS — D225 Melanocytic nevi of trunk: Secondary | ICD-10-CM | POA: Diagnosis not present

## 2020-07-11 DIAGNOSIS — X32XXXS Exposure to sunlight, sequela: Secondary | ICD-10-CM | POA: Diagnosis not present

## 2020-07-26 DIAGNOSIS — Z03818 Encounter for observation for suspected exposure to other biological agents ruled out: Secondary | ICD-10-CM | POA: Diagnosis not present

## 2020-08-07 DIAGNOSIS — Z03818 Encounter for observation for suspected exposure to other biological agents ruled out: Secondary | ICD-10-CM | POA: Diagnosis not present

## 2021-03-22 DIAGNOSIS — U071 COVID-19: Secondary | ICD-10-CM | POA: Diagnosis not present

## 2021-03-22 DIAGNOSIS — I10 Essential (primary) hypertension: Secondary | ICD-10-CM | POA: Diagnosis not present

## 2021-03-22 DIAGNOSIS — R059 Cough, unspecified: Secondary | ICD-10-CM | POA: Diagnosis not present

## 2021-04-05 DIAGNOSIS — Z1231 Encounter for screening mammogram for malignant neoplasm of breast: Secondary | ICD-10-CM | POA: Diagnosis not present

## 2021-04-20 DIAGNOSIS — N951 Menopausal and female climacteric states: Secondary | ICD-10-CM | POA: Diagnosis not present

## 2021-04-20 DIAGNOSIS — N922 Excessive menstruation at puberty: Secondary | ICD-10-CM | POA: Diagnosis not present

## 2021-05-03 DIAGNOSIS — E785 Hyperlipidemia, unspecified: Secondary | ICD-10-CM | POA: Diagnosis not present

## 2021-05-09 DIAGNOSIS — E785 Hyperlipidemia, unspecified: Secondary | ICD-10-CM | POA: Diagnosis not present

## 2021-05-09 DIAGNOSIS — R82998 Other abnormal findings in urine: Secondary | ICD-10-CM | POA: Diagnosis not present

## 2021-05-09 DIAGNOSIS — Z1331 Encounter for screening for depression: Secondary | ICD-10-CM | POA: Diagnosis not present

## 2021-05-09 DIAGNOSIS — Z1339 Encounter for screening examination for other mental health and behavioral disorders: Secondary | ICD-10-CM | POA: Diagnosis not present

## 2021-05-09 DIAGNOSIS — I10 Essential (primary) hypertension: Secondary | ICD-10-CM | POA: Diagnosis not present

## 2021-05-09 DIAGNOSIS — Z0000 Encounter for general adult medical examination without abnormal findings: Secondary | ICD-10-CM | POA: Diagnosis not present

## 2021-05-30 DIAGNOSIS — L812 Freckles: Secondary | ICD-10-CM | POA: Diagnosis not present

## 2021-05-30 DIAGNOSIS — L820 Inflamed seborrheic keratosis: Secondary | ICD-10-CM | POA: Diagnosis not present

## 2021-05-30 DIAGNOSIS — L821 Other seborrheic keratosis: Secondary | ICD-10-CM | POA: Diagnosis not present

## 2021-05-30 DIAGNOSIS — D1801 Hemangioma of skin and subcutaneous tissue: Secondary | ICD-10-CM | POA: Diagnosis not present

## 2021-05-30 DIAGNOSIS — L918 Other hypertrophic disorders of the skin: Secondary | ICD-10-CM | POA: Diagnosis not present

## 2021-07-23 DIAGNOSIS — L918 Other hypertrophic disorders of the skin: Secondary | ICD-10-CM | POA: Diagnosis not present

## 2021-07-23 DIAGNOSIS — L308 Other specified dermatitis: Secondary | ICD-10-CM | POA: Diagnosis not present

## 2022-05-20 DIAGNOSIS — E785 Hyperlipidemia, unspecified: Secondary | ICD-10-CM | POA: Diagnosis not present

## 2022-05-27 DIAGNOSIS — I10 Essential (primary) hypertension: Secondary | ICD-10-CM | POA: Diagnosis not present

## 2022-05-29 DIAGNOSIS — Z1339 Encounter for screening examination for other mental health and behavioral disorders: Secondary | ICD-10-CM | POA: Diagnosis not present

## 2022-05-29 DIAGNOSIS — Z0000 Encounter for general adult medical examination without abnormal findings: Secondary | ICD-10-CM | POA: Diagnosis not present

## 2022-05-29 DIAGNOSIS — Z1331 Encounter for screening for depression: Secondary | ICD-10-CM | POA: Diagnosis not present

## 2022-05-29 DIAGNOSIS — E785 Hyperlipidemia, unspecified: Secondary | ICD-10-CM | POA: Diagnosis not present

## 2022-06-13 DIAGNOSIS — L918 Other hypertrophic disorders of the skin: Secondary | ICD-10-CM | POA: Diagnosis not present

## 2022-06-13 DIAGNOSIS — D2262 Melanocytic nevi of left upper limb, including shoulder: Secondary | ICD-10-CM | POA: Diagnosis not present

## 2022-06-13 DIAGNOSIS — D2261 Melanocytic nevi of right upper limb, including shoulder: Secondary | ICD-10-CM | POA: Diagnosis not present

## 2022-06-13 DIAGNOSIS — L820 Inflamed seborrheic keratosis: Secondary | ICD-10-CM | POA: Diagnosis not present

## 2022-06-13 DIAGNOSIS — L700 Acne vulgaris: Secondary | ICD-10-CM | POA: Diagnosis not present

## 2022-06-13 DIAGNOSIS — L821 Other seborrheic keratosis: Secondary | ICD-10-CM | POA: Diagnosis not present

## 2022-07-09 DIAGNOSIS — N920 Excessive and frequent menstruation with regular cycle: Secondary | ICD-10-CM | POA: Diagnosis not present

## 2022-07-09 DIAGNOSIS — Z130 Encounter for screening for diseases of the blood and blood-forming organs and certain disorders involving the immune mechanism: Secondary | ICD-10-CM | POA: Diagnosis not present

## 2022-07-09 DIAGNOSIS — Z01419 Encounter for gynecological examination (general) (routine) without abnormal findings: Secondary | ICD-10-CM | POA: Diagnosis not present

## 2022-07-09 DIAGNOSIS — R87810 Cervical high risk human papillomavirus (HPV) DNA test positive: Secondary | ICD-10-CM | POA: Diagnosis not present

## 2022-07-09 DIAGNOSIS — Z1231 Encounter for screening mammogram for malignant neoplasm of breast: Secondary | ICD-10-CM | POA: Diagnosis not present

## 2022-07-09 DIAGNOSIS — Z124 Encounter for screening for malignant neoplasm of cervix: Secondary | ICD-10-CM | POA: Diagnosis not present

## 2022-07-09 DIAGNOSIS — Z1151 Encounter for screening for human papillomavirus (HPV): Secondary | ICD-10-CM | POA: Diagnosis not present

## 2022-07-11 ENCOUNTER — Other Ambulatory Visit: Payer: Self-pay | Admitting: Gynecology

## 2022-07-11 DIAGNOSIS — R928 Other abnormal and inconclusive findings on diagnostic imaging of breast: Secondary | ICD-10-CM

## 2022-07-24 ENCOUNTER — Other Ambulatory Visit: Payer: Self-pay | Admitting: Gynecology

## 2022-07-24 ENCOUNTER — Ambulatory Visit
Admission: RE | Admit: 2022-07-24 | Discharge: 2022-07-24 | Disposition: A | Discharge status: Home / Self Care | Payer: BC Managed Care – PPO | Source: Ambulatory Visit | Attending: Gynecology | Admitting: Gynecology

## 2022-07-24 ENCOUNTER — Other Ambulatory Visit: Payer: BC Managed Care – PPO

## 2022-07-24 ENCOUNTER — Ambulatory Visit: Admission: RE | Admit: 2022-07-24 | Payer: BC Managed Care – PPO | Source: Ambulatory Visit

## 2022-07-24 DIAGNOSIS — R928 Other abnormal and inconclusive findings on diagnostic imaging of breast: Secondary | ICD-10-CM

## 2022-07-24 DIAGNOSIS — R921 Mammographic calcification found on diagnostic imaging of breast: Secondary | ICD-10-CM

## 2022-08-05 ENCOUNTER — Ambulatory Visit
Admission: RE | Admit: 2022-08-05 | Discharge: 2022-08-05 | Disposition: A | Discharge status: Home / Self Care | Payer: BC Managed Care – PPO | Source: Ambulatory Visit | Attending: Gynecology | Admitting: Gynecology

## 2022-08-05 DIAGNOSIS — N6012 Diffuse cystic mastopathy of left breast: Secondary | ICD-10-CM | POA: Diagnosis not present

## 2022-08-05 DIAGNOSIS — R921 Mammographic calcification found on diagnostic imaging of breast: Secondary | ICD-10-CM | POA: Diagnosis not present

## 2022-08-19 ENCOUNTER — Other Ambulatory Visit: Payer: Self-pay | Admitting: General Surgery

## 2022-08-19 DIAGNOSIS — Z809 Family history of malignant neoplasm, unspecified: Secondary | ICD-10-CM | POA: Diagnosis not present

## 2022-08-19 DIAGNOSIS — R928 Other abnormal and inconclusive findings on diagnostic imaging of breast: Secondary | ICD-10-CM

## 2022-08-23 ENCOUNTER — Other Ambulatory Visit: Payer: Self-pay | Admitting: General Surgery

## 2022-08-23 DIAGNOSIS — Z809 Family history of malignant neoplasm, unspecified: Secondary | ICD-10-CM

## 2022-08-23 DIAGNOSIS — R928 Other abnormal and inconclusive findings on diagnostic imaging of breast: Secondary | ICD-10-CM

## 2022-08-26 ENCOUNTER — Telehealth: Payer: Self-pay | Admitting: Genetic Counselor

## 2022-08-26 NOTE — Telephone Encounter (Signed)
Scheduled appt per 10/13 referral. Pt is aware of appt date and time. Pt is aware to arrive 15 mins prior to appt time and to bring and updated insurance card. Pt is aware of appt location.   

## 2022-09-10 ENCOUNTER — Ambulatory Visit
Admission: RE | Admit: 2022-09-10 | Discharge: 2022-09-10 | Disposition: A | Discharge status: Home / Self Care | Payer: BC Managed Care – PPO | Source: Ambulatory Visit | Attending: General Surgery | Admitting: General Surgery

## 2022-09-10 DIAGNOSIS — Z809 Family history of malignant neoplasm, unspecified: Secondary | ICD-10-CM

## 2022-09-10 DIAGNOSIS — R928 Other abnormal and inconclusive findings on diagnostic imaging of breast: Secondary | ICD-10-CM

## 2022-09-10 DIAGNOSIS — N6489 Other specified disorders of breast: Secondary | ICD-10-CM | POA: Diagnosis not present

## 2022-09-10 MED ORDER — GADOPICLENOL 0.5 MMOL/ML IV SOLN
10.0000 mL | Freq: Once | INTRAVENOUS | Status: AC | PRN
  Administered 2022-09-10: 10 mL via INTRAVENOUS

## 2022-09-12 ENCOUNTER — Other Ambulatory Visit: Payer: Self-pay | Admitting: General Surgery

## 2022-09-12 DIAGNOSIS — R9389 Abnormal findings on diagnostic imaging of other specified body structures: Secondary | ICD-10-CM

## 2022-09-17 ENCOUNTER — Inpatient Hospital Stay: Payer: BC Managed Care – PPO | Attending: Obstetrics and Gynecology | Admitting: Genetic Counselor

## 2022-09-17 ENCOUNTER — Inpatient Hospital Stay: Payer: BC Managed Care – PPO

## 2022-09-17 DIAGNOSIS — Z803 Family history of malignant neoplasm of breast: Secondary | ICD-10-CM | POA: Diagnosis not present

## 2022-09-17 DIAGNOSIS — Z808 Family history of malignant neoplasm of other organs or systems: Secondary | ICD-10-CM

## 2022-09-17 LAB — GENETIC SCREENING ORDER

## 2022-09-18 ENCOUNTER — Ambulatory Visit
Admission: RE | Admit: 2022-09-18 | Discharge: 2022-09-18 | Disposition: A | Discharge status: Home / Self Care | Payer: BC Managed Care – PPO | Source: Ambulatory Visit | Attending: General Surgery | Admitting: General Surgery

## 2022-09-18 ENCOUNTER — Encounter: Payer: Self-pay | Admitting: Genetic Counselor

## 2022-09-18 DIAGNOSIS — N6022 Fibroadenosis of left breast: Secondary | ICD-10-CM | POA: Diagnosis not present

## 2022-09-18 DIAGNOSIS — R928 Other abnormal and inconclusive findings on diagnostic imaging of breast: Secondary | ICD-10-CM | POA: Diagnosis not present

## 2022-09-18 DIAGNOSIS — N6012 Diffuse cystic mastopathy of left breast: Secondary | ICD-10-CM | POA: Diagnosis not present

## 2022-09-18 DIAGNOSIS — R9389 Abnormal findings on diagnostic imaging of other specified body structures: Secondary | ICD-10-CM

## 2022-09-18 DIAGNOSIS — N6342 Unspecified lump in left breast, subareolar: Secondary | ICD-10-CM | POA: Diagnosis not present

## 2022-09-18 MED ORDER — GADOPICLENOL 0.5 MMOL/ML IV SOLN: 10.0000 mL | Freq: Once | INTRAVENOUS | Status: DC | PRN

## 2022-09-18 NOTE — Progress Notes (Signed)
REFERRING PROVIDER: Stark Klein, MD [place],  [place] [address]  PRIMARY PROVIDER:  Velna Hatchet, MD  PRIMARY REASON FOR VISIT:  1. Family history of breast cancer   2. Family history of melanoma     HISTORY OF PRESENT ILLNESS:   Ms. Gasser, a 41 y.o. female, was seen for a [facility] cancer genetics consultation at the request of Dr. Barry Dienes due to a family history of cancer.  Ms. Politte presents to clinic today to discuss the possibility of a hereditary predisposition to cancer, to discuss genetic testing, and to further clarify her future cancer risks, as well as potential cancer risks for family members.   Ms. Joaquin is a 41 y.o. female with no personal history of cancer.    RISK FACTORS:  Menarche was at age 54.  First live birth at age 52.  OCP use for approximately  14  years.  Ovaries intact: yes.  Uterus intact: yes.  Menopausal status: premenopausal.  HRT use: 0 years. Colonoscopy: no Mammogram within the last year: yes Breast biopsies: left breast biopsy in 07/2022 showed flat epithelial atypia  Up to date with pelvic exams: yes. Any excessive radiation exposure in the past: no  Past Medical History:  Diagnosis Date   Depression    celexa 11m, restarted after twins were born and stayed on it.   Dysrhythmia    history of palpitations-EKG and workup in 04/2011--NSR with PAC's-avoid stimulants   GERD (gastroesophageal reflux disease)    History of kidney stones    Hypertension    Migraine    Newborn product of IVF pregnancy     Past Surgical History:  Procedure Laterality Date   CESAREAN SECTION  09/01/2012   Procedure: CESAREAN SECTION;  Surgeon: KLogan Bores MD;  [facility];  Service: Obstetrics;  Laterality: N/A;  [place] c/s-TWINS   CESAREAN SECTION WITH BILATERAL TUBAL LIGATION Bilateral 08/19/2017   Procedure: CESAREAN SECTION WITH BILATERAL TUBAL LIGATION;  Surgeon: RPaula Compton MD;  [facility]   Service: Obstetrics;  Laterality: Bilateral;  Tracey RNFA   CYSTOSCOPY/URETEROSCOPY/HOLMIUM LASER/STENT PLACEMENT Bilateral 10/10/2017   Procedure: CYSTOSCOPY/URETEROSCOPY/RETROGRADE PYELOGRAM/HOLMIUM LASER/STENT PLACEMENT;  Surgeon: MAlexis Frock MD;  Location: [facility];  Service: Urology;  Laterality: Bilateral;   egg retrieval  2013   IVF-Propofol   MOLE REMOVAL      Social History   Socioeconomic History   Marital status: Married    Spouse name: Not on file   Number of children: Not on file   Years of education: Not on file   Highest education level: Not on file  Occupational History   Not on file  Tobacco Use   Smoking status: Never   Smokeless tobacco: Never  Vaping Use   Vaping Use: Never used  Substance and Sexual Activity   Alcohol use: Yes    Comment:  rarely ; wine trigger for migraine   Drug use: No   Sexual activity: Not on file  Other Topics Concern   Not on file  Social History Narrative   Not on file   Social Determinants of Health   Financial Resource Strain: Not on file  Food Insecurity: Not on file  Transportation Needs: Not on file  Physical Activity: Not on file  Stress: Not on file  Social Connections: Not on file     FAMILY HISTORY:  We obtained a detailed, 4-generation family history.  Significant diagnoses are listed below: Family History  Problem Relation Age  of Onset   Alcohol abuse Mother    Miscarriages / Stillbirths Mother    Fibromyalgia Mother    Hypertension Mother    Melanoma Father 56   Sudden death Paternal Aunt 56   Hypertension Maternal Grandmother    Multiple myeloma Maternal Grandfather 51   Breast cancer Paternal Grandmother 73   Depression Paternal Grandmother    Heart attack Paternal Grandfather 15   Transient ischemic attack Other        MG aunt   Hypertension Other        MGGM     Ms. Malinak's maternal great aunt (grandmother's sister) was diagnosed with breast cancer at age 51, this great aunt's daughter  (Ms. Romberger's first cousin once removed) was diagnosed with breast cancer at age 61. Ms. Soller maternal grandfather was diagnosed with multiple myeloma at age 76, he died at age 10. Her maternal great uncle (grandfather's brother) was diagnosed with prostate cancer at age 102, he died at age 25. Ms. Galentine father was diagnosed with melanoma at age 62, he died at age 24. Her paternal uncle was diagnosed with melanoma at age 36. Her paternal grandmother was diagnosed with breast cancer at age 53. Ms. Neidlinger paternal great uncle (grandfather's brother) was diagnosed with pancreatic cancer at age 35 and died shortly after his diagnosis. Another paternal great uncle (grandfather's brother) was diagnosed with bone cancer at age 60 and died at age 75. Ms. Lanese is unaware of previous family history of genetic testing for hereditary cancer risks. There is no reported Ashkenazi Jewish ancestry.   GENETIC COUNSELING ASSESSMENT: Ms. Klinke is a 41 y.o. female with a family history of cancer which is somewhat suggestive of a hereditary predisposition to cancer given her family history of pancreatic and breast cancer. We, therefore, discussed and recommended the following at today's visit.   DISCUSSION: We discussed that 5 - 10% of cancer is hereditary, with most cases of hereditary breast and pancreatic cancer associated with BRCA1/2.  There are other genes that can be associated with hereditary breast and pancreatic cancer syndromes.  We discussed that testing is beneficial for several reasons, including knowing about other cancer risks, identifying potential screening and risk-reduction options that may be appropriate, and to understanding if other family members could be at risk for cancer and allowing them to undergo genetic testing.  We reviewed the characteristics, features and inheritance patterns of hereditary cancer syndromes. We also discussed genetic testing, including the appropriate family members to test, the  process of testing, insurance coverage and turn-around-time for results. We discussed the implications of a negative, positive, carrier and/or variant of uncertain significant result. We discussed that negative results would be uninformative given that Ms. Sabas does not have a personal history of cancer. We recommended Ms. Eichhorn pursue genetic testing for a panel that contains genes associated with [employer] .  Ms. Ferrick was offered a common hereditary cancer panel (47 genes) and an expanded pan-cancer panel (77 genes). Ms. Deprey was informed of the benefits and limitations of each panel, including that expanded pan-cancer panels contain several genes that do not have clear management guidelines at this point in time.  We also discussed that as the number of genes included on a panel increases, the chances of variants of uncertain significance increases.  After considering the benefits and limitations of each gene panel, Ms. Malstrom elected to have [facility] CancerNext-Expanded Panel.  The CancerNext-Expanded gene panel offered by [facility] and includes sequencing, rearrangement, and RNA  analysis for the following 77 genes: AIP, ALK, APC, ATM, AXIN2, BAP1, BARD1, BLM, BMPR1A, BRCA1, BRCA2, BRIP1, CDC73, CDH1, CDK4, CDKN1B, CDKN2A, CHEK2, CTNNA1, DICER1, FANCC, FH, FLCN, GALNT12, KIF1B, LZTR1, MAX, MEN1, MET, MLH1, MSH2, MSH3, MSH6, MUTYH, NBN, NF1, NF2, NTHL1, PALB2, PHOX2B, PMS2, POT1, PRKAR1A, PTCH1, PTEN, RAD51C, RAD51D, RB1, RECQL, RET, SDHA, SDHAF2, SDHB, SDHC, SDHD, SMAD4, SMARCA4, SMARCB1, SMARCE1, STK11, SUFU, TMEM127, TP53, TSC1, TSC2, VHL and XRCC2 (sequencing and deletion/duplication); EGFR, EGLN1, HOXB13, KIT, MITF, PDGFRA, POLD1, and POLE (sequencing only); EPCAM and GREM1 (deletion/duplication only).    Based on Ms. Crisanti's family history of cancer, she meets medical criteria for genetic testing. Despite that she meets criteria, she may still have an out of pocket cost. We discussed that if her  out of pocket cost for testing is over $100, the laboratory will call and confirm whether she wants to proceed with testing.  If the out of pocket cost of testing is less than $100 she will be billed by the genetic testing laboratory.   We discussed that some people do not want to undergo genetic testing due to fear of genetic discrimination.  A federal law called the Genetic Information Non-Discrimination Act (GINA) of 2008 helps protect individuals against genetic discrimination based on their genetic test results.  It impacts both health insurance and employment.  With health insurance, it protects against increased premiums, being kicked off insurance or being forced to take a test in order to be insured.  For employment it protects against hiring, firing and promoting decisions based on genetic test results.  GINA does not apply to those in the [employer], those who work for companies with less than 15 employees, and new life insurance or long-term disability insurance policies.  Health status due to a cancer diagnosis is not protected under GINA.  PLAN: After considering the risks, benefits, and limitations, Ms. Vesey provided informed consent to pursue genetic testing and the blood sample was sent to [facility] for analysis of the CancerNext-Expanded Panel. Results should be available within approximately 2-3 weeks' time, at which point they will be disclosed by telephone to Ms. Grupe, as will any additional recommendations warranted by these results. Ms. Ishii will receive a summary of her genetic counseling visit and a copy of her results once available. This information will also be available in Epic.   Ms. Parrilla questions were answered to her satisfaction today. Our contact information was provided should additional questions or concerns arise. Thank you for the referral and allowing [place] to share in the care of your patient.   Lucille Passy, MS, [facility] Genetic  Counselor [email or web address] (P) [phone]  The patient was seen for a total of 35 minutes in face-to-face genetic counseling. The patient was seen alone.  Drs. Lindi Adie and/or Burr Medico were available to discuss this case as needed.  _______________________________________________________________________ For Office Staff:  Number of people involved in session: 1 Was an Intern/ student involved with case: no

## 2022-09-24 DIAGNOSIS — N870 Mild cervical dysplasia: Secondary | ICD-10-CM | POA: Diagnosis not present

## 2022-09-24 DIAGNOSIS — R87810 Cervical high risk human papillomavirus (HPV) DNA test positive: Secondary | ICD-10-CM | POA: Diagnosis not present

## 2022-09-24 DIAGNOSIS — N879 Dysplasia of cervix uteri, unspecified: Secondary | ICD-10-CM | POA: Diagnosis not present

## 2022-09-26 ENCOUNTER — Other Ambulatory Visit: Payer: Self-pay | Admitting: General Surgery

## 2022-09-26 DIAGNOSIS — R928 Other abnormal and inconclusive findings on diagnostic imaging of breast: Secondary | ICD-10-CM | POA: Diagnosis not present

## 2022-09-27 ENCOUNTER — Telehealth: Payer: Self-pay | Admitting: Genetic Counselor

## 2022-09-27 ENCOUNTER — Encounter: Payer: Self-pay | Admitting: Genetic Counselor

## 2022-09-27 DIAGNOSIS — Z9189 Other specified personal risk factors, not elsewhere classified: Secondary | ICD-10-CM | POA: Insufficient documentation

## 2022-09-27 DIAGNOSIS — Z1379 Encounter for other screening for genetic and chromosomal anomalies: Secondary | ICD-10-CM | POA: Insufficient documentation

## 2022-09-27 NOTE — Telephone Encounter (Signed)
I contacted Rebecca Armstrong to discuss her genetic testing results. No pathogenic variants were identified in the 77 genes analyzed. Detailed clinic note to follow.  The test report has been scanned into EPIC and is located under the Molecular Pathology section of the Results Review tab.  A portion of the result report is included below for reference.   Lalla Brothers, MS, [facility] Genetic Counselor [email or web address] (P) [phone]

## 2022-10-02 ENCOUNTER — Encounter: Payer: Self-pay | Admitting: Genetic Counselor

## 2022-10-02 ENCOUNTER — Ambulatory Visit: Payer: Self-pay | Admitting: Genetic Counselor

## 2022-10-02 DIAGNOSIS — Z1379 Encounter for other screening for genetic and chromosomal anomalies: Secondary | ICD-10-CM

## 2022-10-02 DIAGNOSIS — Z9189 Other specified personal risk factors, not elsewhere classified: Secondary | ICD-10-CM

## 2022-10-02 NOTE — Progress Notes (Signed)
HPI:   Ms. Idleman was previously seen in the [facility] due to a family history of cancer and concerns regarding a hereditary predisposition to cancer. Please refer to our prior cancer genetics clinic note for more information regarding our discussion, assessment and recommendations, at the time. Ms. Tarter recent genetic test results were disclosed to her, as were recommendations warranted by these results. These results and recommendations are discussed in more detail below.  CANCER HISTORY:  Oncology History   No history exists.    FAMILY HISTORY:  We obtained a detailed, 4-generation family history.  Significant diagnoses are listed below:      Family History  Problem Relation Age of Onset   Alcohol abuse Mother     Miscarriages / Stillbirths Mother     Fibromyalgia Mother     Hypertension Mother     Melanoma Father 93   Sudden death Paternal Aunt 56   Hypertension Maternal Grandmother     Multiple myeloma Maternal Grandfather 20   Breast cancer Paternal Grandmother 24   Depression Paternal Grandmother     Heart attack Paternal Grandfather 57   Transient ischemic attack Other          MG aunt   Hypertension Other          MGGM       Ms. Khokhar's maternal great aunt (grandmother's sister) was diagnosed with breast cancer at age 79, this great aunt's daughter (Ms. Deerman's first cousin once removed) was diagnosed with breast cancer at age 68. Ms. Farnworth maternal grandfather was diagnosed with multiple myeloma at age 62, he died at age 52. Her maternal great uncle (grandfather's brother) was diagnosed with prostate cancer at age 78, he died at age 26. Ms. Bixler father was diagnosed with melanoma at age 35, he died at age 77. Her paternal uncle was diagnosed with melanoma at age 74. Her paternal grandmother was diagnosed with breast cancer at age 33. Ms. Swamy paternal great uncle (grandfather's brother) was diagnosed with pancreatic cancer at age 44 and died  shortly after his diagnosis. Another paternal great uncle (grandfather's brother) was diagnosed with bone cancer at age 18 and died at age 46. Ms. Samons is unaware of previous family history of genetic testing for hereditary cancer risks. There is no reported Ashkenazi Jewish ancestry.   GENETIC TEST RESULTS:  The [facility] CancerNext-Expanded Panel found no pathogenic mutations.  The CancerNext-Expanded gene panel offered by [facility] and includes sequencing, rearrangement, and RNA analysis for the following 77 genes: AIP, ALK, APC, ATM, AXIN2, BAP1, BARD1, BLM, BMPR1A, BRCA1, BRCA2, BRIP1, CDC73, CDH1, CDK4, CDKN1B, CDKN2A, CHEK2, CTNNA1, DICER1, FANCC, FH, FLCN, GALNT12, KIF1B, LZTR1, MAX, MEN1, MET, MLH1, MSH2, MSH3, MSH6, MUTYH, NBN, NF1, NF2, NTHL1, PALB2, PHOX2B, PMS2, POT1, PRKAR1A, PTCH1, PTEN, RAD51C, RAD51D, RB1, RECQL, RET, SDHA, SDHAF2, SDHB, SDHC, SDHD, SMAD4, SMARCA4, SMARCB1, SMARCE1, STK11, SUFU, TMEM127, TP53, TSC1, TSC2, VHL and XRCC2 (sequencing and deletion/duplication); EGFR, EGLN1, HOXB13, KIT, MITF, PDGFRA, POLD1, and POLE (sequencing only); EPCAM and GREM1 (deletion/duplication only).    The test report has been scanned into EPIC and is located under the Molecular Pathology section of the Results Review tab.  A portion of the result report is included below for reference. Genetic testing reported out on 09/25/2022.       Even though a pathogenic variant was not identified, possible explanations for the cancer in the family may include: There may be no hereditary risk for cancer in the family. The  cancers in her family may be due to other genetic or environmental factors. There may be a gene mutation in one of these genes that current testing methods cannot detect, but that chance is small. There could be another gene that has not yet been discovered, or that we have not yet tested, that is responsible for the cancer diagnoses in the family.  It is also possible there is a  hereditary cause for the cancer in the family that Ms. Nephew did not inherit.  Therefore, it is important to remain in touch with cancer genetics in the future so that we can continue to offer Ms. Paolella the most up to date genetic testing.   ADDITIONAL GENETIC TESTING:  We discussed with Ms. Spielberg that her genetic testing was fairly extensive.  If there are genes identified to increase cancer risk that can be analyzed in the future, we would be happy to discuss and coordinate this testing at that time.    CANCER SCREENING RECOMMENDATIONS:  Ms. Ventress test result is considered negative (normal).  This means that we have not identified a hereditary cause for her family history of cancer at this time.   An individual's cancer risk and medical management are not determined by genetic test results alone. Overall cancer risk assessment incorporates additional factors, including personal medical history, family history, and any available genetic information that may result in a personalized plan for cancer prevention and surveillance. Therefore, it is recommended she continue to follow the cancer management and screening guidelines provided by her healthcare providers.  Based on the reported personal and family history, specific cancer screenings for Ms. Jasmine Awe and her family include:  Breast Cancer Screening:  The Tyrer-Cuzick model is one of multiple prediction models developed to estimate an individual's lifetime risk of developing breast cancer. The Tyrer-Cuzick model is endorsed by the [employer]  (NCCN). This model includes many risk factors such as family history, endogenous estrogen exposure, and benign breast disease. The calculation is highly-dependent on the accuracy of clinical data provided by the patient and can change over time. The Tyrer-Cuzick model may be repeated to reflect new information in her personal or family history in the future.   Ms. Biber  Tyrer-Cuzick risk score is 35.8%.  For women with a greater than 20% lifetime risk of breast cancer, the NCCN recommends the following:    1.   Clinical encounter every 6-12 months to begin when identified as being at increased risk, but not before age 15    2.   Annual mammograms, tomosynthesis is recommended starting 10 years earlier than the youngest breast cancer diagnosis in the family or at age 73 (whichever comes first), but not before age 30     3.   Annual breast MRI starting 10 years earlier than the youngest breast cancer diagnosis in the family or at age 62 (whichever comes first), but not before age 7  We, therefore, discussed that it is reasonable for Ms. Somero to be followed by a high-risk breast cancer clinic. She declined a referral to [facility]'s high risk clinic at this time.   RECOMMENDATIONS FOR FAMILY MEMBERS:   Since she did not inherit a mutation in a cancer predisposition gene included on this panel, her children could not have inherited a mutation from her in one of these genes. Individuals in this family might be at some increased risk of developing cancer, over the general population risk, due to the family history of cancer. We  recommend women in this family have a yearly mammogram beginning at age 38, or 83 years younger than the earliest onset of cancer, an annual clinical breast exam, and perform monthly breast self-exams.  FOLLOW-UP:  Cancer genetics is a rapidly advancing field and it is possible that new genetic tests will be appropriate for her and/or her family members in the future. We encouraged her to remain in contact with cancer genetics on an annual basis so we can update her personal and family histories and let her know of advances in cancer genetics that may benefit this family.   Our contact number was provided. Ms. Gfeller questions were answered to her satisfaction, and she knows she is welcome to call us at anytime with additional questions or  concerns.   Lucille Passy, MS, [facility] Genetic Counselor [email or web address] (P) [phone]

## 2022-11-13 ENCOUNTER — Encounter (HOSPITAL_BASED_OUTPATIENT_CLINIC_OR_DEPARTMENT_OTHER): Payer: Self-pay | Admitting: General Surgery

## 2022-11-18 NOTE — H&P (Signed)
REFERRING PHYSICIAN:  Linde Gillis   PROVIDER:  Matthias Hughs, MD   Care Team: Patient Care Team: Michaela Corner, MD as PCP - General (Nephrology) Matthias Hughs, MD as Consulting Provider (Surgical Oncology) Oliver Pila, MD (Obstetrics and Gynecology) Sebastian Ache, MD (Urology)    MRN: [id number] DOB: 12-20-1980   Subjective    Chief Complaint: New Consultation   History of Present Illness: Rebecca Armstrong is a 42 y.o. female who is seen today as an office consultation at the request of Dr. Freada Bergeron for evaluation of breast issue   Patient presents with a screening detected left breast asymmetry.  Diagnostic imaging was then performed.  This showed a persistent area of asymmetry in the upper outer quadrant that was approximately 7 mm.  There were some punctate and amorphous calcifications in this area as well.  Stereotactic biopsy was performed which demonstrated flat epithelial atypia with fibrocystic change and adenosis, PASH, calcifications.  Patient is referred to consider excision.  She has not required a breast biopsy before.  She denies any significant breast pain.   The patient has not personally had cancer, but her father died of melanoma, paternal grandmother had breast cancer at age 49.  Her paternal grandfather had multiple myeloma.   The patient is a mother of 3 with twins at age 32 and a 62-year-old.  She works part-time in Engineering geologist.  She is accompanied by her mother-in-law who helped significantly with the care of the children. Her husband likes to deer hunt.       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   FINDINGS: On today's additional diagnostic views, including spot compression and magnification views, there is a subtle persistent asymmetry within the upper-outer quadrant of the LEFT breast, at posterior depth, with associated punctate and amorphous calcifications, the calcifications measuring 7 mm extent, as measured on spot compression cc slice 33.   IMPRESSION: Grouped punctate and amorphous calcifications within the upper-outer quadrant of the LEFT breast, at posterior depth, measuring 7 mm extent, with subtle underlying asymmetry. This may represent benign fibrocystic change. Stereotactic biopsy is recommended to exclude malignancy.   RECOMMENDATION: Stereotactic biopsy for the grouped punctate and amorphous calcifications within the upper-outer quadrant of the LEFT breast, measuring 7 mm extent, with subtle underlying asymmetry.   Stereotactic biopsy will be scheduled at patient's convenience.   I have discussed the findings and recommendations with the patient. If applicable, a reminder letter will be sent to the patient regarding the next appointment.   BI-RADS CATEGORY  4: Suspicious.   Pathology core needle biopsy: 08/05/2022 Breast, left, needle core biopsy, upper outer quadrant, x clip - FLAT EPITHELIAL ATYPIA. SEE NOTE - FIBROCYSTIC CHANGE WITH ADENOSIS, PSEUDOANGIOMATOUS STROMAL HYPERPLASIA AND CALCIFICATIONS     Review of Systems: A complete review of systems was obtained from the patient.  I have reviewed this information and discussed as appropriate with the patient.  See HPI as well for other ROS. ROS \\positive  for diarrhea, headache, treated depression, occasional sense of palpitations.         Medical History: Past Medical History      Past Medical History:  Diagnosis Date  Anxiety     Hypertension     Sleep apnea             Patient Active Problem List  Diagnosis   Abnormal mammogram of left breast   Family history of cancer       Past Surgical History       Past Surgical History:  Procedure Laterality Date   CESAREAN SECTION       egg retrieval for IVF       LITHOTRIPSY       REPEAT CESAREAN SECTION            Allergies      Allergies  Allergen Reactions   Codeine Nausea And Vomiting              Current Outpatient Medications on File Prior to Visit  Medication Sig Dispense Refill   ADDERALL XR 30 mg XR capsule 1 capsule in the morning Orally Once a day for 30 days       DULoxetine (CYMBALTA) 60 MG DR capsule TAKE ONE CAPSULE EACH DAY       eletriptan (RELPAX) 40 MG tablet TAKE ONE TABLET EVERY DAY AS NEEDED FOR migraine       losartan (COZAAR) 50 MG tablet Take 50 mg by mouth at bedtime        No current facility-administered medications on file prior to visit.      Family History       Family History  Problem Relation Age of Onset   Skin cancer Father     High blood pressure (Hypertension) Maternal Grandmother     Breast cancer Paternal Grandmother     Deep vein thrombosis (DVT or abnormal blood clot formation) Paternal Grandfather          Social History       Tobacco Use  Smoking Status Never  Smokeless Tobacco Never      Social History  Social History         Socioeconomic History   Marital status: Married  Tobacco Use   Smoking status: Never   Smokeless tobacco: Never  Substance and Sexual Activity   Alcohol use: Yes      Comment: ocasional   Drug use: Never        Objective:         Vitals:      BP: 120/72  Pulse: 101  Temp: 37.4 C (99.3 F)  SpO2: 98%  Weight: 95.7 kg (211 lb)  Height: 163.8 cm (5' 4.5")    Body mass index is 35.66 kg/m.   Gen:  No acute distress.  Well nourished and well groomed.   Neurological: Alert and oriented to person, place, and time. Coordination normal.  Head: Normocephalic and atraumatic.  Eyes: Conjunctivae are normal. Pupils are equal, round, and reactive to light. No scleral icterus.  Neck: Normal range of  motion. Neck supple. No tracheal deviation or thyromegaly present.  Cardiovascular: Normal rate, regular rhythm, normal heart sounds and intact distal pulses.  Exam reveals no gallop and no friction rub.  No murmur heard. Breast: left breast larger than right.  Moderate ptosis.  No palpable masses. No skin dimpling.  No axillary adenopathy, no nipple retraction or nipple discharge.   Respiratory: Effort normal.  No respiratory distress. No chest wall tenderness. Breath sounds normal.  No wheezes, rales or rhonchi.  GI: Soft. Bowel sounds are normal. The abdomen is soft and nontender.  There is no rebound and no guarding.  Musculoskeletal: Normal range of motion. Extremities are nontender.  Lymphadenopathy: No cervical, preauricular, postauricular or axillary adenopathy is present Skin: Skin is warm and dry. No rash noted. No diaphoresis. No erythema. No pallor. No clubbing, cyanosis, or edema.   Psychiatric: Normal mood and affect. Behavior is normal. Judgment and thought content normal.      Labs N/a   Assessment and Plan:        ICD-10-CM    1. Abnormal mammogram of left breast  R92.8       2. Family history of cancer  Z80.9         I do recommend that the patient have a seed localized excisional biopsy.  I discussed the procedure with the patient.  I reviewed the seed placement.  I reviewed that this is an outpatient procedure and she would be put to sleep.  I discussed the process of surgery including breast and mammography.  I reviewed that she would have dissolvable stitches and would have a breast binder which I would recommend that she wore for approximately a week.  I discussed that there is a possibility of there being noninvasive or invasive cancer in the specimen and this would determine if she needed any additional surgery or additional treatment.  Reviewed risk of bleeding, infection, fluid buildup, or other anesthetic complications like heart or lung complications and blood clot.   Patient would like to do this in January after the holidays.   With the patient's paternal history of melanoma and paternal grandmother with breast cancer, she is interested in pursuing genetic testing.  We will refer her for this.

## 2022-11-19 ENCOUNTER — Ambulatory Visit
Admission: RE | Admit: 2022-11-19 | Discharge: 2022-11-19 | Disposition: A | Discharge status: Home / Self Care | Payer: BC Managed Care – PPO | Source: Ambulatory Visit | Attending: General Surgery | Admitting: General Surgery

## 2022-11-19 ENCOUNTER — Encounter (HOSPITAL_BASED_OUTPATIENT_CLINIC_OR_DEPARTMENT_OTHER)
Admission: RE | Admit: 2022-11-19 | Discharge: 2022-11-19 | Disposition: A | Discharge status: Home / Self Care | Payer: BC Managed Care – PPO | Source: Ambulatory Visit | Attending: General Surgery | Admitting: General Surgery

## 2022-11-19 ENCOUNTER — Other Ambulatory Visit: Payer: Self-pay | Admitting: General Surgery

## 2022-11-19 DIAGNOSIS — Z01818 Encounter for other preprocedural examination: Secondary | ICD-10-CM | POA: Insufficient documentation

## 2022-11-19 DIAGNOSIS — F419 Anxiety disorder, unspecified: Secondary | ICD-10-CM | POA: Diagnosis not present

## 2022-11-19 DIAGNOSIS — Z79899 Other long term (current) drug therapy: Secondary | ICD-10-CM | POA: Diagnosis not present

## 2022-11-19 DIAGNOSIS — K219 Gastro-esophageal reflux disease without esophagitis: Secondary | ICD-10-CM | POA: Diagnosis not present

## 2022-11-19 DIAGNOSIS — G4730 Sleep apnea, unspecified: Secondary | ICD-10-CM | POA: Diagnosis not present

## 2022-11-19 DIAGNOSIS — R928 Other abnormal and inconclusive findings on diagnostic imaging of breast: Secondary | ICD-10-CM | POA: Diagnosis not present

## 2022-11-19 DIAGNOSIS — Z808 Family history of malignant neoplasm of other organs or systems: Secondary | ICD-10-CM | POA: Diagnosis not present

## 2022-11-19 DIAGNOSIS — Z803 Family history of malignant neoplasm of breast: Secondary | ICD-10-CM | POA: Diagnosis not present

## 2022-11-19 DIAGNOSIS — F32A Depression, unspecified: Secondary | ICD-10-CM | POA: Diagnosis not present

## 2022-11-19 DIAGNOSIS — I10 Essential (primary) hypertension: Secondary | ICD-10-CM | POA: Diagnosis not present

## 2022-11-19 DIAGNOSIS — N6012 Diffuse cystic mastopathy of left breast: Secondary | ICD-10-CM | POA: Diagnosis not present

## 2022-11-19 HISTORY — PX: BREAST BIOPSY: SHX20

## 2022-11-19 LAB — POCT PREGNANCY, URINE: Preg Test, Ur: NEGATIVE

## 2022-11-19 NOTE — Progress Notes (Signed)

## 2022-11-20 ENCOUNTER — Ambulatory Visit (HOSPITAL_BASED_OUTPATIENT_CLINIC_OR_DEPARTMENT_OTHER): Payer: BC Managed Care – PPO | Admitting: Anesthesiology

## 2022-11-20 ENCOUNTER — Ambulatory Visit
Admit: 2022-11-20 | Discharge: 2022-11-20 | Disposition: A | Discharge status: Home / Self Care | Payer: BC Managed Care – PPO

## 2022-11-20 ENCOUNTER — Encounter (HOSPITAL_BASED_OUTPATIENT_CLINIC_OR_DEPARTMENT_OTHER)
Admission: RE | Disposition: A | Discharge status: Home / Self Care | Payer: Self-pay | Source: Ambulatory Visit | Attending: General Surgery

## 2022-11-20 ENCOUNTER — Ambulatory Visit
Admit: 2022-11-20 | Discharge: 2022-11-20 | Disposition: A | Discharge status: Home / Self Care | Payer: BC Managed Care – PPO | Attending: General Surgery | Admitting: General Surgery

## 2022-11-20 ENCOUNTER — Other Ambulatory Visit: Payer: Self-pay

## 2022-11-20 ENCOUNTER — Ambulatory Visit
Admission: RE | Admit: 2022-11-20 | Discharge: 2022-11-20 | Disposition: A | Discharge status: Home / Self Care | Payer: BC Managed Care – PPO | Source: Ambulatory Visit | Attending: General Surgery | Admitting: General Surgery

## 2022-11-20 ENCOUNTER — Ambulatory Visit (HOSPITAL_BASED_OUTPATIENT_CLINIC_OR_DEPARTMENT_OTHER)
Admission: RE | Admit: 2022-11-20 | Discharge: 2022-11-20 | Disposition: A | Discharge status: Home / Self Care | Payer: BC Managed Care – PPO | Source: Ambulatory Visit | Attending: General Surgery | Admitting: General Surgery

## 2022-11-20 DIAGNOSIS — F419 Anxiety disorder, unspecified: Secondary | ICD-10-CM | POA: Diagnosis not present

## 2022-11-20 DIAGNOSIS — K219 Gastro-esophageal reflux disease without esophagitis: Secondary | ICD-10-CM | POA: Diagnosis not present

## 2022-11-20 DIAGNOSIS — F32A Depression, unspecified: Secondary | ICD-10-CM | POA: Insufficient documentation

## 2022-11-20 DIAGNOSIS — R928 Other abnormal and inconclusive findings on diagnostic imaging of breast: Secondary | ICD-10-CM | POA: Diagnosis not present

## 2022-11-20 DIAGNOSIS — G4730 Sleep apnea, unspecified: Secondary | ICD-10-CM | POA: Insufficient documentation

## 2022-11-20 DIAGNOSIS — N6012 Diffuse cystic mastopathy of left breast: Secondary | ICD-10-CM | POA: Insufficient documentation

## 2022-11-20 DIAGNOSIS — Z01818 Encounter for other preprocedural examination: Secondary | ICD-10-CM

## 2022-11-20 DIAGNOSIS — Z803 Family history of malignant neoplasm of breast: Secondary | ICD-10-CM | POA: Diagnosis not present

## 2022-11-20 DIAGNOSIS — Z79899 Other long term (current) drug therapy: Secondary | ICD-10-CM | POA: Insufficient documentation

## 2022-11-20 DIAGNOSIS — I10 Essential (primary) hypertension: Secondary | ICD-10-CM | POA: Insufficient documentation

## 2022-11-20 DIAGNOSIS — Z808 Family history of malignant neoplasm of other organs or systems: Secondary | ICD-10-CM | POA: Diagnosis not present

## 2022-11-20 HISTORY — PX: RADIOACTIVE SEED GUIDED EXCISIONAL BREAST BIOPSY: SHX6490

## 2022-11-20 SURGERY — RADIOACTIVE SEED GUIDED BREAST BIOPSY
Anesthesia: General | Site: Breast | Laterality: Left

## 2022-11-20 MED ORDER — HEPARIN (PORCINE) IN NACL 1000-0.9 UT/500ML-% IV SOLN
INTRAVENOUS | Status: AC
  Filled 2022-11-20: qty 500

## 2022-11-20 MED ORDER — OXYCODONE HCL 5 MG PO TABS: 5.0000 mg | ORAL_TABLET | Freq: Four times a day (QID) | ORAL | 0 refills | Status: DC | PRN

## 2022-11-20 MED ORDER — CHLORHEXIDINE GLUCONATE CLOTH 2 % EX PADS: 6.0000 | MEDICATED_PAD | Freq: Once | CUTANEOUS | Status: DC

## 2022-11-20 MED ORDER — FENTANYL CITRATE (PF) 100 MCG/2ML IJ SOLN
INTRAMUSCULAR | Status: DC | PRN
  Administered 2022-11-20 (×3): 50 ug via INTRAVENOUS

## 2022-11-20 MED ORDER — FENTANYL CITRATE (PF) 100 MCG/2ML IJ SOLN
INTRAMUSCULAR | Status: AC
  Filled 2022-11-20: qty 2

## 2022-11-20 MED ORDER — PROPOFOL 10 MG/ML IV BOLUS
INTRAVENOUS | Status: AC
  Filled 2022-11-20: qty 20

## 2022-11-20 MED ORDER — CIPROFLOXACIN IN D5W 400 MG/200ML IV SOLN
400.0000 mg | INTRAVENOUS | Status: AC
  Administered 2022-11-20: 400 mg via INTRAVENOUS

## 2022-11-20 MED ORDER — ONDANSETRON HCL 4 MG/2ML IJ SOLN
INTRAMUSCULAR | Status: AC
  Filled 2022-11-20: qty 2

## 2022-11-20 MED ORDER — ACETAMINOPHEN 500 MG PO TABS
1000.0000 mg | ORAL_TABLET | ORAL | Status: AC
  Administered 2022-11-20: 1000 mg via ORAL

## 2022-11-20 MED ORDER — ONDANSETRON HCL 4 MG/2ML IJ SOLN: 4.0000 mg | Freq: Four times a day (QID) | INTRAMUSCULAR | Status: DC | PRN

## 2022-11-20 MED ORDER — PROPOFOL 10 MG/ML IV BOLUS
INTRAVENOUS | Status: DC | PRN
  Administered 2022-11-20: 30 mg via INTRAVENOUS
  Administered 2022-11-20: 200 mg via INTRAVENOUS

## 2022-11-20 MED ORDER — LIDOCAINE-EPINEPHRINE (PF) 1 %-1:200000 IJ SOLN
INTRAMUSCULAR | Status: DC | PRN
  Administered 2022-11-20: 40 mL

## 2022-11-20 MED ORDER — FENTANYL CITRATE (PF) 100 MCG/2ML IJ SOLN: 25.0000 ug | INTRAMUSCULAR | Status: DC | PRN

## 2022-11-20 MED ORDER — HEPARIN SOD (PORK) LOCK FLUSH 100 UNIT/ML IV SOLN
INTRAVENOUS | Status: AC
  Filled 2022-11-20: qty 5

## 2022-11-20 MED ORDER — 0.9 % SODIUM CHLORIDE (POUR BTL) OPTIME
TOPICAL | Status: DC | PRN
  Administered 2022-11-20: 75 mL

## 2022-11-20 MED ORDER — OXYCODONE HCL 5 MG/5ML PO SOLN: 5.0000 mg | Freq: Once | ORAL | Status: AC | PRN

## 2022-11-20 MED ORDER — OXYCODONE HCL 5 MG PO TABS
ORAL_TABLET | ORAL | Status: AC
  Filled 2022-11-20: qty 1

## 2022-11-20 MED ORDER — MIDAZOLAM HCL 5 MG/5ML IJ SOLN
INTRAMUSCULAR | Status: DC | PRN
  Administered 2022-11-20: 2 mg via INTRAVENOUS

## 2022-11-20 MED ORDER — MIDAZOLAM HCL 2 MG/2ML IJ SOLN
INTRAMUSCULAR | Status: AC
  Filled 2022-11-20: qty 2

## 2022-11-20 MED ORDER — LIDOCAINE 2% (20 MG/ML) 5 ML SYRINGE
INTRAMUSCULAR | Status: AC
  Filled 2022-11-20: qty 5

## 2022-11-20 MED ORDER — ONDANSETRON HCL 4 MG/2ML IJ SOLN
INTRAMUSCULAR | Status: DC | PRN
  Administered 2022-11-20: 4 mg via INTRAVENOUS

## 2022-11-20 MED ORDER — CIPROFLOXACIN IN D5W 400 MG/200ML IV SOLN
INTRAVENOUS | Status: AC
  Filled 2022-11-20: qty 200

## 2022-11-20 MED ORDER — DEXAMETHASONE SODIUM PHOSPHATE 4 MG/ML IJ SOLN
INTRAMUSCULAR | Status: DC | PRN
  Administered 2022-11-20: 10 mg via INTRAVENOUS

## 2022-11-20 MED ORDER — PROPOFOL 500 MG/50ML IV EMUL
INTRAVENOUS | Status: DC | PRN
  Administered 2022-11-20: 200 ug/kg/min via INTRAVENOUS

## 2022-11-20 MED ORDER — LACTATED RINGERS IV SOLN: INTRAVENOUS | Status: DC

## 2022-11-20 MED ORDER — BUPIVACAINE HCL (PF) 0.5 % IJ SOLN
INTRAMUSCULAR | Status: AC
  Filled 2022-11-20: qty 60

## 2022-11-20 MED ORDER — ACETAMINOPHEN 500 MG PO TABS
ORAL_TABLET | ORAL | Status: AC
  Filled 2022-11-20: qty 2

## 2022-11-20 MED ORDER — OXYCODONE HCL 5 MG PO TABS
5.0000 mg | ORAL_TABLET | Freq: Once | ORAL | Status: AC | PRN
  Administered 2022-11-20: 5 mg via ORAL

## 2022-11-20 MED ORDER — DEXAMETHASONE SODIUM PHOSPHATE 10 MG/ML IJ SOLN
INTRAMUSCULAR | Status: AC
  Filled 2022-11-20: qty 1

## 2022-11-20 MED ORDER — LIDOCAINE-EPINEPHRINE (PF) 1 %-1:200000 IJ SOLN
INTRAMUSCULAR | Status: AC
  Filled 2022-11-20: qty 60

## 2022-11-20 SURGICAL SUPPLY — 53 items
ADH SKN CLS APL DERMABOND .7 (GAUZE/BANDAGES/DRESSINGS) ×1
APL PRP STRL LF DISP 70% ISPRP (MISCELLANEOUS) ×1
BINDER BREAST XXLRG (GAUZE/BANDAGES/DRESSINGS) IMPLANT
BLADE SURG 10 STRL SS (BLADE) ×1 IMPLANT
BLADE SURG 15 STRL LF DISP TIS (BLADE) IMPLANT
BLADE SURG 15 STRL SS (BLADE)
CANISTER SUC SOCK COL 7IN (MISCELLANEOUS) IMPLANT
CANISTER SUCT 1200ML W/VALVE (MISCELLANEOUS) ×1 IMPLANT
CHLORAPREP W/TINT 26 (MISCELLANEOUS) ×1 IMPLANT
CLIP TI LARGE 6 (CLIP) ×1 IMPLANT
COVER BACK TABLE 60X90IN (DRAPES) ×1 IMPLANT
COVER MAYO STAND STRL (DRAPES) ×1 IMPLANT
COVER PROBE CYLINDRICAL 5X96 (MISCELLANEOUS) ×1 IMPLANT
DERMABOND ADVANCED .7 DNX12 (GAUZE/BANDAGES/DRESSINGS) ×1 IMPLANT
DRAPE LAPAROSCOPIC ABDOMINAL (DRAPES) ×1 IMPLANT
DRAPE UTILITY XL STRL (DRAPES) ×1 IMPLANT
ELECT COATED BLADE 2.86 ST (ELECTRODE) ×1 IMPLANT
ELECT REM PT RETURN 9FT ADLT (ELECTROSURGICAL) ×1
ELECTRODE REM PT RTRN 9FT ADLT (ELECTROSURGICAL) ×1 IMPLANT
GAUZE SPONGE 4X4 12PLY STRL LF (GAUZE/BANDAGES/DRESSINGS) ×1 IMPLANT
GLOVE BIO SURGEON STRL SZ 6 (GLOVE) ×1 IMPLANT
GLOVE BIO SURGEON STRL SZ 6.5 (GLOVE) IMPLANT
GLOVE BIOGEL PI IND STRL 6.5 (GLOVE) ×1 IMPLANT
GLOVE BIOGEL PI IND STRL 7.5 (GLOVE) IMPLANT
GOWN STRL REUS W/ TWL LRG LVL3 (GOWN DISPOSABLE) ×1 IMPLANT
GOWN STRL REUS W/ TWL XL LVL3 (GOWN DISPOSABLE) IMPLANT
GOWN STRL REUS W/TWL 2XL LVL3 (GOWN DISPOSABLE) ×1 IMPLANT
GOWN STRL REUS W/TWL LRG LVL3 (GOWN DISPOSABLE) ×1
GOWN STRL REUS W/TWL XL LVL3 (GOWN DISPOSABLE) ×1
KIT MARKER MARGIN INK (KITS) ×1 IMPLANT
LIGHT WAVEGUIDE WIDE FLAT (MISCELLANEOUS) IMPLANT
NDL HYPO 25X1 1.5 SAFETY (NEEDLE) ×1 IMPLANT
NEEDLE HYPO 25X1 1.5 SAFETY (NEEDLE) ×1 IMPLANT
NS IRRIG 1000ML POUR BTL (IV SOLUTION) ×1 IMPLANT
PACK BASIN DAY SURGERY FS (CUSTOM PROCEDURE TRAY) ×1 IMPLANT
PENCIL SMOKE EVACUATOR (MISCELLANEOUS) ×1 IMPLANT
SLEEVE SCD COMPRESS KNEE MED (STOCKING) ×1 IMPLANT
SPIKE FLUID TRANSFER (MISCELLANEOUS) IMPLANT
SPONGE T-LAP 18X18 ~~LOC~~+RFID (SPONGE) ×1 IMPLANT
STAPLER VISISTAT 35W (STAPLE) IMPLANT
STRIP CLOSURE SKIN 1/2X4 (GAUZE/BANDAGES/DRESSINGS) ×1 IMPLANT
SUT MON AB 4-0 PC3 18 (SUTURE) ×1 IMPLANT
SUT SILK 2 0 SH (SUTURE) IMPLANT
SUT VIC AB 2-0 SH 18 (SUTURE) IMPLANT
SUT VIC AB 3-0 SH 27 (SUTURE) ×1
SUT VIC AB 3-0 SH 27X BRD (SUTURE) ×1 IMPLANT
SUT VICRYL 3-0 CR8 SH (SUTURE) IMPLANT
SYR BULB EAR ULCER 3OZ GRN STR (SYRINGE) ×1 IMPLANT
SYR CONTROL 10ML LL (SYRINGE) ×1 IMPLANT
TOWEL GREEN STERILE FF (TOWEL DISPOSABLE) ×1 IMPLANT
TRAY FAXITRON CT DISP (TRAY / TRAY PROCEDURE) ×1 IMPLANT
TUBE CONNECTING 20X1/4 (TUBING) ×1 IMPLANT
YANKAUER SUCT BULB TIP NO VENT (SUCTIONS) ×1 IMPLANT

## 2022-11-20 NOTE — Op Note (Addendum)
Left Breast Radioactive seed localized excisional biopsy x 3  Indications: This patient presents with history of abnormal left mammogram with flat epithelial atypia on needle biopsy.    She subsequently got an MRI due to family history and breast density.  She had two other sites biopsied after the MRI and one showed sclerosing adenosis and the other was another flat epithelial atypia.  All were recommended to be removed.    Pre-operative Diagnosis: abnormal left breast imaging  Post-operative Diagnosis: same  Surgeon: [place]:  Malachi Pro, PA-C  Anesthesia: General endotracheal anesthesia  ASA Class: 2  Procedure Details  The patient was seen in the Holding Room. The risks, benefits, complications, treatment options, and expected outcomes were discussed with the patient. The possibilities of bleeding, infection, the need for additional procedures, failure to diagnose a condition, and creating a complication requiring transfusion or operation were discussed with the patient. The patient concurred with the proposed plan, giving informed consent.  The site of surgery properly noted/marked. The patient was taken to Operating Room # 8, identified, and the procedure verified as Left Breast seed localized excisional biopsy x 3. A Time Out was held and the above information confirmed.  The left breast and chest were prepped and draped in standard fashion.The seeds were located transdermally with the neoprobe.  These were at approximately 12 o'clock, 2 o'clock, and 3:30 o'clock.   A lateral incision was made between the previously placed radioactive seeds at 2 and 3:30.  Dissection was carried down around the point of maximum signal intensity of the 2 o'clock lesion first. The cautery was used to perform the dissection.   The specimen was inked with the margin marker paint kit.    Specimen radiography confirmed inclusion of the seed, however the clip was not present.  The seed was close  to the medial and inferior margins on the 3D faxitron image, so additional margins were taken at these locations.  The clip was seen in the medial margin.   The 3:30 lesion was then addressed.  This one was very close to the skin and the cautery was used to take this specimen.  The clip and seed were present in the specimen radiograph.    I thought I would need to make another incision, but as the 2 o'clock specimen was rather deep, the 12 o'clock seed was able to be removed via the same incision.  The cautery was used to perform the dissection.  The clip and seed were in the specimen radiograph.  All the areas were irrigated.  Hemostasis was achieved.  One large hemaclip was placed into each cavity.     The background signal in the breast was zero.  The tissue fell nicely and no mastopexy was performed.   The wound was irrigated and closed with 3-0 vicryl interrupted deep dermal sutures and 4-0 monocryl running subcuticular suture.      Sterile dressings were applied. At the end of the operation, all sponge, instrument, and needle counts were correct.  Findings: Three seeds and three clips were removed.  The 3:30 lesion had skin as the anterior margin.  The 2 o'clock lesion had pectoralis as the posterior margin.  The cavities were not contiguous.    Estimated Blood Loss:  min         Specimens: left breast tissue with seed 2 o'clock, 2 o'clock medial margin, 2 o'clock inferior margin,  left breast tissue with seed 3:30 o'clock, left breast tissue with  seed 12 o'clock.           Complications:  None; patient tolerated the procedure well.         Disposition: PACU - hemodynamically stable.         Condition: stable

## 2022-11-20 NOTE — Anesthesia Procedure Notes (Signed)
Procedure Name: LMA Insertion Date/Time: 11/20/2022 1:51 PM  Performed by: Maryella Shivers, CRNAPre-anesthesia Checklist: Patient identified, Emergency Drugs available, Suction available and Patient being monitored Patient Re-evaluated:Patient Re-evaluated prior to induction Oxygen Delivery Method: Circle system utilized Preoxygenation: Pre-oxygenation with 100% oxygen Induction Type: IV induction Ventilation: Mask ventilation without difficulty LMA: LMA inserted LMA Size: 4.0 Number of attempts: 1 Airway Equipment and Method: Bite block Placement Confirmation: positive ETCO2 Tube secured with: Tape Dental Injury: Teeth and Oropharynx as per pre-operative assessment

## 2022-11-20 NOTE — Anesthesia Preprocedure Evaluation (Signed)
Anesthesia Evaluation  Patient identified by MRN, date of birth, ID band Patient awake    Reviewed: Allergy & Precautions, H&P , NPO status , Patient's Chart, lab work & pertinent test results  Airway Mallampati: II   Neck ROM: full    Dental   Pulmonary neg pulmonary ROS   breath sounds clear to auscultation       Cardiovascular hypertension,  Rhythm:regular Rate:Normal     Neuro/Psych  Headaches PSYCHIATRIC DISORDERS  Depression       GI/Hepatic ,GERD  ,,  Endo/Other    Renal/GU stones     Musculoskeletal   Abdominal   Peds  Hematology   Anesthesia Other Findings   Reproductive/Obstetrics                             Anesthesia Physical Anesthesia Plan  ASA: 2  Anesthesia Plan: General   Post-op Pain Management:    Induction: Intravenous  PONV Risk Score and Plan: 3 and Ondansetron, Dexamethasone, Midazolam and Treatment may vary due to age or medical condition  Airway Management Planned: LMA  Additional Equipment:   Intra-op Plan:   Post-operative Plan: Extubation in OR  Informed Consent: I have reviewed the patients History and Physical, chart, labs and discussed the procedure including the risks, benefits and alternatives for the proposed anesthesia with the patient or authorized representative who has indicated his/her understanding and acceptance.     Dental advisory given  Plan Discussed with: CRNA, Anesthesiologist and Surgeon  Anesthesia Plan Comments:        Anesthesia Quick Evaluation

## 2022-11-20 NOTE — Transfer of Care (Signed)
Immediate Anesthesia Transfer of Care Note  Patient: Rebecca Armstrong  Procedure(s) Performed: RADIOACTIVE SEED GUIDED EXCISIONAL LEFT BREAST BIOPSY (Left: Breast)  Patient Location: PACU  Anesthesia Type:General  Level of Consciousness: sedated  Airway & Oxygen Therapy: Patient Spontanous Breathing and Patient connected to face mask oxygen  Post-op Assessment: Report given to RN and Post -op Vital signs reviewed and stable  Post vital signs: Reviewed and stable  Last Vitals:  Vitals Value Taken Time  BP 106/64 11/20/22 1530  Temp    Pulse 70 11/20/22 1530  Resp 12 11/20/22 1530  SpO2 100 % 11/20/22 1530  Vitals shown include unvalidated device data.  Last Pain:  Vitals:   11/20/22 1141  TempSrc: Oral  PainSc: 0-No pain      Patients Stated Pain Goal: 3 (62/70/35 0093)  Complications: No notable events documented.

## 2022-11-20 NOTE — Interval H&P Note (Signed)
History and Physical Interval Note:  11/20/2022 1:33 PM  Rebecca Armstrong  has presented today for surgery, with the diagnosis of LEFT BREAST ABNORMAL MAMMOGRAM.  The various methods of treatment have been discussed with the patient and family. After consideration of risks, benefits and other options for treatment, the patient has consented to  Procedure(s): RADIOACTIVE SEED GUIDED EXCISIONAL LEFT BREAST BIOPSY (Left) as a surgical intervention.  The patient's history has been reviewed, patient examined, no change in status, stable for surgery.  I have reviewed the patient's chart and labs.  Questions were answered to the patient's satisfaction.     Stark Klein

## 2022-11-20 NOTE — Discharge Instructions (Addendum)
[place] Office Phone Number [phone]  BREAST BIOPSY/ PARTIAL MASTECTOMY: POST OP INSTRUCTIONS  Always review your discharge instruction sheet given to you by the facility where your surgery was performed.  IF YOU HAVE DISABILITY OR FAMILY LEAVE FORMS, YOU MUST BRING THEM TO THE OFFICE FOR PROCESSING.  DO NOT GIVE THEM TO YOUR DOCTOR.  Take 2 tylenol (acetominophen) three times a day for 3 days.  If you still have pain, add ibuprofen with food in between if able to take this (if you have kidney issues or stomach issues, do not take ibuprofen).  If both of those are not enough, add the narcotic pain pill.  If you find you are needing a lot of this overnight after surgery, call the next morning for a refill.    Prescriptions will not be filled after 5pm or on week-ends. Take your usually prescribed medications unless otherwise directed You should eat very light the first 24 hours after surgery, such as soup, crackers, pudding, etc.  Resume your normal diet the day after surgery. Most patients will experience some swelling and bruising in the breast.  Ice packs and a good support bra will help.  Swelling and bruising can take several days to resolve.  It is common to experience some constipation if taking pain medication after surgery.  Increasing fluid intake and taking a stool softener will usually help or prevent this problem from occurring.  A mild laxative (Milk of Magnesia or Miralax) should be taken according to package directions if there are no bowel movements after 48 hours. Unless discharge instructions indicate otherwise, you may remove your bandages 48 hours after surgery, and you may shower at that time.  You may have steri-strips (small skin tapes) in place directly over the incision.  These strips should be left on the skin at least for for 7-10 days.    ACTIVITIES:  You may resume regular daily activities (gradually increasing) beginning the next day.  Wearing a  good support bra or sports bra (or the breast binder) minimizes pain and swelling.  You may have sexual intercourse when it is comfortable. No heavy lifting for 1-2 weeks (not over around 10 pounds).  You may drive when you no longer are taking prescription pain medication, you can comfortably wear a seatbelt, and you can safely maneuver your car and apply brakes. RETURN TO WORK:  __________3-14 days depending on job. _______________ Dennis Bast should see your doctor in the office for a follow-up appointment approximately two weeks after your surgery.  Your doctor's nurse will typically make your follow-up appointment when she calls you with your pathology report.  Expect your pathology report 3-4 business days after your surgery.  You may call to check if you do not hear from [place] after three days.   WHEN TO CALL YOUR DOCTOR: Fever over 101.0 Nausea and/or vomiting. Extreme swelling or bruising. Continued bleeding from incision. Increased pain, redness, or drainage from the incision.  The clinic staff is available to answer your questions during regular business hours.  Please don't hesitate to call and ask to speak to one of the nurses for clinical concerns.  If you have a medical emergency, go to the nearest emergency room or call 911.  A surgeon from [facility] is always on call at the hospital.  For further questions, please visit [email or web address]    Post Anesthesia Home Care Instructions  Activity: Get plenty of rest for the remainder of the day. A responsible individual must stay  with you for 24 hours following the procedure.  For the next 24 hours, DO NOT: -Drive a car -Paediatric nurse -Drink alcoholic beverages -Take any medication unless instructed by your physician -Make any legal decisions or sign important papers.  Meals: Start with liquid foods such as gelatin or soup. Progress to regular foods as tolerated. Avoid greasy, spicy, heavy foods. If nausea  and/or vomiting occur, drink only clear liquids until the nausea and/or vomiting subsides. Call your physician if vomiting continues.  Special Instructions/Symptoms: Your throat may feel dry or sore from the anesthesia or the breathing tube placed in your throat during surgery. If this causes discomfort, gargle with warm salt water. The discomfort should disappear within 24 hours.  If you had a scopolamine patch placed behind your ear for the management of post- operative nausea and/or vomiting:  1. The medication in the patch is effective for 72 hours, after which it should be removed.  Wrap patch in a tissue and discard in the trash. Wash hands thoroughly with soap and water. 2. You may remove the patch earlier than 72 hours if you experience unpleasant side effects which may include dry mouth, dizziness or visual disturbances. 3. Avoid touching the patch. Wash your hands with soap and water after contact with the patch.    Post Anesthesia Home Care Instructions  Activity: Get plenty of rest for the remainder of the day. A responsible individual must stay with you for 24 hours following the procedure.  For the next 24 hours, DO NOT: -Drive a car -Paediatric nurse -Drink alcoholic beverages -Take any medication unless instructed by your physician -Make any legal decisions or sign important papers.  Meals: Start with liquid foods such as gelatin or soup. Progress to regular foods as tolerated. Avoid greasy, spicy, heavy foods. If nausea and/or vomiting occur, drink only clear liquids until the nausea and/or vomiting subsides. Call your physician if vomiting continues.  Special Instructions/Symptoms: Your throat may feel dry or sore from the anesthesia or the breathing tube placed in your throat during surgery. If this causes discomfort, gargle with warm salt water. The discomfort should disappear within 24 hours.  You may take Tylenol after 6pm if needed.

## 2022-11-21 ENCOUNTER — Encounter (HOSPITAL_BASED_OUTPATIENT_CLINIC_OR_DEPARTMENT_OTHER): Payer: Self-pay | Admitting: General Surgery

## 2022-11-21 NOTE — Anesthesia Postprocedure Evaluation (Signed)
Anesthesia Post Note  Patient: Rebecca Armstrong  Procedure(s) Performed: RADIOACTIVE SEED GUIDED EXCISIONAL LEFT BREAST BIOPSY (Left: Breast)     Patient location during evaluation: PACU Anesthesia Type: General Level of consciousness: awake and alert Pain management: pain level controlled Vital Signs Assessment: post-procedure vital signs reviewed and stable Respiratory status: spontaneous breathing, nonlabored ventilation, respiratory function stable and patient connected to nasal cannula oxygen Cardiovascular status: blood pressure returned to baseline and stable Postop Assessment: no apparent nausea or vomiting Anesthetic complications: no   No notable events documented.  Last Vitals:  Vitals:   11/20/22 1600 11/20/22 1630  BP: 109/64 (!) 149/91  Pulse: 70 87  Resp: 13 18  Temp:  (!) 36.3 C  SpO2: 95% 95%    Last Pain:  Vitals:   11/20/22 1630  TempSrc:   PainSc: [place]

## 2022-11-22 LAB — SURGICAL PATHOLOGY

## 2022-12-31 ENCOUNTER — Encounter (HOSPITAL_COMMUNITY): Payer: Self-pay

## 2023-03-10 DIAGNOSIS — F909 Attention-deficit hyperactivity disorder, unspecified type: Secondary | ICD-10-CM | POA: Diagnosis not present

## 2023-03-10 DIAGNOSIS — F330 Major depressive disorder, recurrent, mild: Secondary | ICD-10-CM | POA: Diagnosis not present

## 2023-03-11 DIAGNOSIS — R0683 Snoring: Secondary | ICD-10-CM | POA: Diagnosis not present

## 2023-03-11 DIAGNOSIS — R400 Somnolence: Secondary | ICD-10-CM | POA: Diagnosis not present

## 2023-03-11 DIAGNOSIS — I10 Essential (primary) hypertension: Secondary | ICD-10-CM | POA: Diagnosis not present

## 2023-03-18 DIAGNOSIS — F4323 Adjustment disorder with mixed anxiety and depressed mood: Secondary | ICD-10-CM | POA: Diagnosis not present

## 2023-03-26 DIAGNOSIS — F4323 Adjustment disorder with mixed anxiety and depressed mood: Secondary | ICD-10-CM | POA: Diagnosis not present

## 2023-04-10 DIAGNOSIS — F909 Attention-deficit hyperactivity disorder, unspecified type: Secondary | ICD-10-CM | POA: Diagnosis not present

## 2023-04-10 DIAGNOSIS — F330 Major depressive disorder, recurrent, mild: Secondary | ICD-10-CM | POA: Diagnosis not present

## 2023-04-17 DIAGNOSIS — F909 Attention-deficit hyperactivity disorder, unspecified type: Secondary | ICD-10-CM | POA: Diagnosis not present

## 2023-04-17 DIAGNOSIS — F330 Major depressive disorder, recurrent, mild: Secondary | ICD-10-CM | POA: Diagnosis not present

## 2023-04-21 ENCOUNTER — Institutional Professional Consult (permissible substitution): Payer: BC Managed Care – PPO | Admitting: Neurology

## 2023-04-21 ENCOUNTER — Encounter: Payer: Self-pay | Admitting: *Deleted

## 2023-04-21 ENCOUNTER — Telehealth: Payer: Self-pay | Admitting: Neurology

## 2023-04-21 DIAGNOSIS — F330 Major depressive disorder, recurrent, mild: Secondary | ICD-10-CM | POA: Diagnosis not present

## 2023-04-21 DIAGNOSIS — F909 Attention-deficit hyperactivity disorder, unspecified type: Secondary | ICD-10-CM | POA: Diagnosis not present

## 2023-04-22 ENCOUNTER — Ambulatory Visit (INDEPENDENT_AMBULATORY_CARE_PROVIDER_SITE_OTHER): Payer: BC Managed Care – PPO | Admitting: Neurology

## 2023-04-22 ENCOUNTER — Encounter: Payer: Self-pay | Admitting: Neurology

## 2023-04-22 VITALS — BP 135/90 | HR 84 | Ht 64.0 in | Wt 211.2 lb

## 2023-04-22 DIAGNOSIS — R351 Nocturia: Secondary | ICD-10-CM

## 2023-04-22 DIAGNOSIS — R0681 Apnea, not elsewhere classified: Secondary | ICD-10-CM

## 2023-04-22 DIAGNOSIS — R0683 Snoring: Secondary | ICD-10-CM

## 2023-04-22 DIAGNOSIS — E669 Obesity, unspecified: Secondary | ICD-10-CM

## 2023-04-22 DIAGNOSIS — G4719 Other hypersomnia: Secondary | ICD-10-CM

## 2023-04-22 DIAGNOSIS — Z9189 Other specified personal risk factors, not elsewhere classified: Secondary | ICD-10-CM | POA: Diagnosis not present

## 2023-04-22 DIAGNOSIS — R519 Headache, unspecified: Secondary | ICD-10-CM

## 2023-04-22 NOTE — Progress Notes (Signed)
Subjective:    Patient ID: Rebecca Armstrong is a 42 y.o. female.  HPI    Huston Foley, MD, PhD [facility] [address] [address] [place], [place] [address]  Dear Lowanda Foster,  I saw your patient, Rebecca Armstrong, upon your kind request in my sleep clinic today for initial consultation of her sleep disorder, in particular, concern for underlying obstructive sleep apnea.  The patient is unaccompanied today.  As you know, Rebecca Armstrong is a 42 year old female with an underlying medical history of reflux disease, hypertension, hyperlipidemia, migraine headaches, history of kidney stones, ADHD, depression, and obesity, who reports snoring and excessive daytime somnolence and occasional morning headaches.  Her Epworth sleepiness score is 13 out of 24, fatigue severity score is 43 out of 63.  I reviewed your office note from 03/11/2023.  She used to see Dr. Santiago Glad for her migraine headaches.  She has had witnessed apneas per family, particularly her mom, when she stays with her.  Mom lives about 4 hours away.  Patient lives with her husband, 18 year old twins and 73-year-old son who does not always sleep well.  She is not aware of any family history of sleep apnea.  She does have a family history of migraines, on mom side and her dad also has migraines.  She has no nightly nocturia but on most nights she will have to get up once.  She drinks caffeine in the form of coffee, 1 or 2 cups in the morning, she is a non-smoker and drinks alcohol rarely, less than once a month.  Bedtime is generally around 10 PM but could be as late as midnight, rise time between 7 and 7:30 AM.  She does have a TV in her bedroom but it is typically not on at night, sometimes she is on her phone at night.  They do not have any pets in the household.  She works part-time in [employer], occupational.  Her Past Medical History Is Significant For: Past Medical History:  Diagnosis Date   ADHD    Depression     celexa 20mg , restarted after twins were born and stayed on it.   Dysrhythmia    history of palpitations-EKG and workup in 04/2011--NSR with PAC's-avoid stimulants   Family hx of melanoma    GERD (gastroesophageal reflux disease)    History of kidney stones    Hyperlipidemia    Hypertension    Migraine    Newborn product of IVF pregnancy     Her Past Surgical History Is Significant For: Past Surgical History:  Procedure Laterality Date   BREAST BIOPSY  11/19/2022   MM LT RADIOACTIVE SEED LOC MAMMO GUIDE 11/19/2022 [facility]   BREAST BIOPSY  11/19/2022   MM LT RADIOACTIVE SEED EA ADD LESION LOC MAMMO GUIDE 11/19/2022 [facility]   BREAST BIOPSY  11/19/2022   MM LT RADIOACTIVE SEED EA ADD LESION LOC MAMMO GUIDE 11/19/2022 [facility]   CESAREAN SECTION  09/01/2012   Procedure: CESAREAN SECTION;  Surgeon: Oliver Pila, MD;  Location: [facility];  Service: Obstetrics;  Laterality: N/A;  [place] c/s-TWINS   CESAREAN SECTION WITH BILATERAL TUBAL LIGATION Bilateral 08/19/2017   Procedure: CESAREAN SECTION WITH BILATERAL TUBAL LIGATION;  Surgeon: Huel Cote, MD;  Location: [facility];  Service: Obstetrics;  Laterality: Bilateral;  Tracey RNFA   CYSTOSCOPY/URETEROSCOPY/HOLMIUM LASER/STENT PLACEMENT Bilateral 10/10/2017   Procedure: CYSTOSCOPY/URETEROSCOPY/RETROGRADE PYELOGRAM/HOLMIUM LASER/STENT PLACEMENT;  Surgeon: Sebastian Ache, MD;  Location: [facility];  Service: Urology;  Laterality: Bilateral;   egg retrieval  2013   IVF-Propofol   MOLE REMOVAL     RADIOACTIVE SEED GUIDED EXCISIONAL BREAST BIOPSY Left 11/20/2022   Procedure: RADIOACTIVE SEED GUIDED EXCISIONAL LEFT BREAST BIOPSY;  Surgeon: Almond Lint, MD;  Location: [facility];  Service: General;  Laterality: Left;    Her Family History Is Significant For: Family History  Problem Relation Age of Onset   Alcohol abuse Mother    Miscarriages / Stillbirths Mother    Fibromyalgia Mother     Hypertension Mother    Melanoma Father 58   Sudden death Paternal Aunt 52   Hypertension Maternal Grandmother    Multiple myeloma Maternal Grandfather 54   Breast cancer Paternal Grandmother 13   Depression Paternal Grandmother    Heart attack Paternal Grandfather 9   Transient ischemic attack Other        MG aunt   Hypertension Other        MGGM   Sleep apnea Neg Hx     Her Social History Is Significant For: Social History   Socioeconomic History   Marital status: Married    Spouse name: Not on file   Number of children: Not on file   Years of education: Not on file   Highest education level: Not on file  Occupational History   Not on file  Tobacco Use   Smoking status: Never   Smokeless tobacco: Never  Vaping Use   Vaping Use: Never used  Substance and Sexual Activity   Alcohol use: Yes    Comment: rarley   Drug use: No   Sexual activity: Not on file  Other Topics Concern   Not on file  Social History Narrative   Not on file   Social Determinants of Health   Financial Resource Strain: Not on file  Food Insecurity: Not on file  Transportation Needs: Not on file  Physical Activity: Not on file  Stress: Not on file  Social Connections: Not on file    Her Allergies Are:  Allergies  Allergen Reactions   Dilaudid [Hydromorphone Hcl] Nausea And Vomiting   Adhesive [Tape] Itching and Rash   Codeine Nausea And Vomiting   Keflex [Cephalexin] Rash    "rash all over abdomen"  :   Her Current Medications Are:  Outpatient Encounter Medications as of 04/22/2023  Medication Sig   acetaminophen (TYLENOL) 500 MG tablet Take 1,000 mg by mouth every 6 (six) hours as needed for mild pain.   B Complex-C (SUPER B COMPLEX PO) Take 1 tablet 3 (three) times a week by mouth.    DULoxetine (CYMBALTA) 60 MG capsule Take 60 mg by mouth daily.   eletriptan (RELPAX) 40 MG tablet Take 40 mg by mouth as needed for migraine or headache. May repeat in 2 hours if headache persists or  recurs.   lisdexamfetamine (VYVANSE) 40 MG capsule Take 40 mg by mouth every morning.   losartan (COZAAR) 50 MG tablet Take 50 mg by mouth daily.   oxyCODONE (OXY IR/ROXICODONE) 5 MG immediate release tablet Take 1 tablet (5 mg total) by mouth every 6 (six) hours as needed for severe pain.   senna-docusate (SENOKOT-S) 8.6-50 MG tablet Take 1 tablet by mouth 2 (two) times daily. While taking strongest pain meds to prevent constipation.   No facility-administered encounter medications on file as of 04/22/2023.  :   Review of Systems:  Out of a complete 14 point review of systems, all are reviewed and negative with  the exception of these symptoms as listed below:  Review of Systems  Neurological:        Pt here for sleep consult Pt snores,headache ,fatigue ,hypertension Pt denies sleep study,CPAP machine   ESS:13 FSS:43     Objective:  Neurological Exam  Physical Exam Physical Examination:   Vitals:   04/22/23 1035  BP: (!) 135/90  Pulse: 84    General Examination: The patient is a very pleasant 42 y.o. female in no acute distress. She appears well-developed and well-nourished and well groomed.   HEENT: Normocephalic, atraumatic, pupils are equal, round and reactive to light, extraocular tracking is good without limitation to gaze excursion or nystagmus noted. Hearing is grossly intact. Face is symmetric with normal facial animation. Speech is clear with no dysarthria noted. There is no hypophonia. There is no lip, neck/head, jaw or voice tremor. Neck is supple with full range of passive and active motion. There are no carotid bruits on auscultation. Oropharynx exam reveals: no significant mouth dryness, good dental hygiene and moderate airway crowding, due to smaller airway entry, tonsils of about 1-2+ bilat., uvula larger/prominent, Mallampati is class III. Tongue protrudes centrally and palate elevates symmetrically. Neck size is 15.75 inches. She has a minimal overbite.   Chest:  Clear to auscultation without wheezing, rhonchi or crackles noted.  Heart: S1+S2+0, regular and normal without murmurs, rubs or gallops noted.   Abdomen: Soft, non-tender and non-distended.  Extremities: There is no pitting edema in the distal lower extremities bilaterally.   Skin: Warm and dry without trophic changes noted.   Musculoskeletal: exam reveals no obvious joint deformities.   Neurologically:  Mental status: The patient is awake, alert and oriented in all 4 spheres. Her immediate and remote memory, attention, language skills and fund of knowledge are appropriate. There is no evidence of aphasia, agnosia, apraxia or anomia. Speech is clear with normal prosody and enunciation. Thought process is linear. Mood is normal and affect is normal.  Cranial nerves II - XII are as described above under HEENT exam.  Motor exam: Normal bulk, strength and tone is noted. There is no obvious action or resting tremor.  Fine motor skills and coordination: grossly intact.  Cerebellar testing: No dysmetria or intention tremor. There is no truncal or gait ataxia.  Sensory exam: intact to light touch in the upper and lower extremities.  Gait, station and balance: She stands easily. No veering to one side is noted. No leaning to one side is noted. Posture is age-appropriate and stance is narrow based. Gait shows normal stride length and normal pace. No problems turning are noted.   Assessment and Plan:  In summary, Rebecca Armstrong is a very pleasant 42 y.o.-year old female with an underlying medical history of reflux disease, hypertension, hyperlipidemia, migraine headaches, history of kidney stones, ADHD, depression, and obesity, whose history and physical exam are concerning for sleep disordered breathing, supporting a current working diagnosis of unspecified sleep apnea, particularly obstructive sleep apnea (OSA). While a laboratory attended sleep study is typically considered "gold standard" for  evaluation of sleep disordered breathing, we mutually agreed to pursue a home sleep test at this time.   I had a long chat with the patient about my findings and the diagnosis of sleep apnea, particularly OSA, its prognosis and treatment options. We talked about medical/conservative treatments, surgical interventions and non-pharmacological approaches for symptom control. I explained, in particular, the risks and ramifications of untreated moderate to severe OSA, especially with respect to developing cardiovascular disease  down the road, including congestive heart failure (CHF), difficult to treat hypertension, cardiac arrhythmias (particularly A-fib), neurovascular complications including TIA, stroke and dementia. Even type 2 diabetes has, in part, been linked to untreated OSA. Symptoms of untreated OSA may include (but may not be limited to) daytime sleepiness, nocturia (i.e. frequent nighttime urination), memory problems, mood irritability and suboptimally controlled or worsening mood disorder such as depression and/or anxiety, lack of energy, lack of motivation, physical discomfort, as well as recurrent headaches, especially morning or nocturnal headaches. We talked about the importance of maintaining a healthy lifestyle and striving for healthy weight.  In addition, we talked about the importance of striving for and maintaining good sleep hygiene. I recommended a sleep study at this time. I outlined the differences between a laboratory attended sleep study which is considered more comprehensive and accurate over the option of a home sleep test (HST); the latter may lead to underestimation of sleep disordered breathing in some instances and does not help with diagnosing upper airway resistance syndrome and is not accurate enough to diagnose primary central sleep apnea typically. I outlined possible surgical and non-surgical treatment options of OSA, including the use of a positive airway pressure (PAP) device  (i.e. CPAP, AutoPAP/APAP or BiPAP in certain circumstances), a custom-made dental device (aka oral appliance, which would require a referral to a specialist dentist or orthodontist typically, and is generally speaking not considered for patients with full dentures or edentulous state), upper airway surgical options, such as traditional UPPP (which is not considered a first-line treatment) or the Inspire device (hypoglossal nerve stimulator, which would involve a referral for consultation with an ENT surgeon, after careful selection, following inclusion criteria - also not first-line treatment). I explained the PAP treatment option to the patient in detail, as this is generally considered first-line treatment.  The patient indicated that she would be willing to try PAP therapy, if the need arises. I explained the importance of being compliant with PAP treatment, not only for insurance purposes but primarily to improve patient's symptoms symptoms, and for the patient's long term health benefit, including to reduce Her cardiovascular risks longer-term.    We will pick up our discussion about the next steps and treatment options after testing.  We will keep her posted as to the test results by phone call and/or MyChart messaging where possible.  We will plan to follow-up in sleep clinic accordingly as well.  I answered all her questions today and the patient was in agreement.   I encouraged her to call with any interim questions, concerns, problems or updates or email [place] through MyChart.  Generally speaking, sleep test authorizations may take up to 2 weeks, sometimes less, sometimes longer, the patient is encouraged to get in touch with [place] if they do not hear back from the sleep lab staff directly within the next 2 weeks.  Thank you very much for allowing me to participate in the care of this nice patient. If I can be of any further assistance to you please do not hesitate to call me at  [phone].  Sincerely,   Huston Foley, MD, PhD

## 2023-04-22 NOTE — Patient Instructions (Signed)

## 2023-05-01 DIAGNOSIS — F330 Major depressive disorder, recurrent, mild: Secondary | ICD-10-CM | POA: Diagnosis not present

## 2023-05-01 DIAGNOSIS — F909 Attention-deficit hyperactivity disorder, unspecified type: Secondary | ICD-10-CM | POA: Diagnosis not present

## 2023-05-08 DIAGNOSIS — F330 Major depressive disorder, recurrent, mild: Secondary | ICD-10-CM | POA: Diagnosis not present

## 2023-05-08 DIAGNOSIS — F909 Attention-deficit hyperactivity disorder, unspecified type: Secondary | ICD-10-CM | POA: Diagnosis not present

## 2023-05-12 ENCOUNTER — Encounter: Payer: Self-pay | Admitting: Neurology

## 2023-05-14 DIAGNOSIS — F330 Major depressive disorder, recurrent, mild: Secondary | ICD-10-CM | POA: Diagnosis not present

## 2023-05-14 DIAGNOSIS — F909 Attention-deficit hyperactivity disorder, unspecified type: Secondary | ICD-10-CM | POA: Diagnosis not present

## 2023-05-20 DIAGNOSIS — F330 Major depressive disorder, recurrent, mild: Secondary | ICD-10-CM | POA: Diagnosis not present

## 2023-05-20 DIAGNOSIS — F909 Attention-deficit hyperactivity disorder, unspecified type: Secondary | ICD-10-CM | POA: Diagnosis not present

## 2023-05-21 ENCOUNTER — Ambulatory Visit: Payer: BC Managed Care – PPO | Admitting: Neurology

## 2023-05-21 DIAGNOSIS — G4733 Obstructive sleep apnea (adult) (pediatric): Secondary | ICD-10-CM

## 2023-05-21 DIAGNOSIS — R0681 Apnea, not elsewhere classified: Secondary | ICD-10-CM

## 2023-05-21 DIAGNOSIS — R351 Nocturia: Secondary | ICD-10-CM

## 2023-05-21 DIAGNOSIS — Z9189 Other specified personal risk factors, not elsewhere classified: Secondary | ICD-10-CM

## 2023-05-21 DIAGNOSIS — R519 Headache, unspecified: Secondary | ICD-10-CM

## 2023-05-21 DIAGNOSIS — G4719 Other hypersomnia: Secondary | ICD-10-CM

## 2023-05-21 DIAGNOSIS — R0683 Snoring: Secondary | ICD-10-CM

## 2023-05-21 DIAGNOSIS — E669 Obesity, unspecified: Secondary | ICD-10-CM

## 2023-05-26 DIAGNOSIS — F909 Attention-deficit hyperactivity disorder, unspecified type: Secondary | ICD-10-CM | POA: Diagnosis not present

## 2023-05-26 DIAGNOSIS — F330 Major depressive disorder, recurrent, mild: Secondary | ICD-10-CM | POA: Diagnosis not present

## 2023-06-03 DIAGNOSIS — F909 Attention-deficit hyperactivity disorder, unspecified type: Secondary | ICD-10-CM | POA: Diagnosis not present

## 2023-06-03 DIAGNOSIS — F330 Major depressive disorder, recurrent, mild: Secondary | ICD-10-CM | POA: Diagnosis not present

## 2023-06-05 NOTE — Addendum Note (Signed)
Addended by: Huston Foley on: 06/05/2023 02:28 PM   Modules accepted: Orders

## 2023-06-05 NOTE — Procedures (Signed)
[facility]  HOME SLEEP TEST (Watch PAT) REPORT - Mail-out Device  STUDY DATE: 05/30/2023  DOB: [birth year]  MRN: [id number]  ORDERING CLINICIAN: Huston Foley, MD, PhD   REFERRING CLINICIAN: Lydia Guiles, NP  CLINICAL INFORMATION/HISTORY: 42 year old female with an underlying medical history of reflux disease, hypertension, hyperlipidemia, migraine headaches, history of kidney stones, ADHD, depression, and obesity, who reports snoring and excessive daytime somnolence and occasional morning headaches.   Epworth sleepiness score: 13/24.  BMI: 36.1 kg/m  FINDINGS:   Sleep Summary:   Total Recording Time (hours, min): 8 hours, 20 min  Total Sleep Time (hours, min):  6 hours, 38 min  Percent REM (%):    5.3%   Respiratory Indices:   Calculated pAHI (per hour):  17.6/hour         REM pAHI:    N/A       NREM pAHI: 17.6/hour  Central pAHI: 0.5/hour  Oxygen Saturation Statistics:    Oxygen Saturation (%) Mean: 95%   Minimum oxygen saturation (%):                 90%   O2 Saturation Range (%): 90 - 99%    O2 Saturation (minutes) <=88%: 0 min  Pulse Rate Statistics:   Pulse Mean (bpm):    72/min    Pulse Range (51 - 102/min)   IMPRESSION: OSA (obstructive sleep apnea)   RECOMMENDATION:  This home sleep test demonstrates moderate obstructive sleep apnea - by number of events - with a total AHI of 17.6/hour and O2 nadir of 90%.  The reduced percentage of REM sleep or inconclusive REM detection may have led to an underestimation of her sleep disordered breathing.  Mild to moderate snoring was detected. Treatment with a positive airway pressure (PAP) device is recommended. The patient will be advised to proceed with an autoPAP titration/trial at home for now. A full night titration study may be considered to optimize treatment settings, monitor proper oxygen saturations and aid with improvement of tolerance and adherence, if needed down the road.  Alternative treatment options may include a dental device through dentistry or orthodontics in selected patients or Inspire (hypoglossal nerve stimulator) in carefully selected patients (meeting inclusion criteria).  Concomitant weight loss is recommended (where clinically appropriate). Please note that untreated obstructive sleep apnea may carry additional perioperative morbidity. Patients with significant obstructive sleep apnea should receive perioperative PAP therapy and the surgeons and particularly the anesthesiologist should be informed of the diagnosis and the severity of the sleep disordered breathing. The patient should be cautioned not to drive, work at heights, or operate dangerous or heavy equipment when tired or sleepy. Review and reiteration of good sleep hygiene measures should be pursued with any patient. Other causes of the patient's symptoms, including circadian rhythm disturbances, an underlying mood disorder, medication effect and/or an underlying medical problem cannot be ruled out based on this test. Clinical correlation is recommended.  The patient and her referring provider will be notified of the test results. The patient will be seen in follow up in sleep clinic at [facility].  I certify that I have reviewed the raw data recording prior to the issuance of this report in accordance with the standards of the American Academy of Sleep Medicine (AASM).  INTERPRETING PHYSICIAN:   Huston Foley, MD, PhD Medical Director, [facility]) Diplomat, ABPN (Neurology and Sleep)   [facility] [address], [address] [place], [place] [address] [phone]

## 2023-06-09 NOTE — Telephone Encounter (Addendum)
New, Almon Register, CMA; Kathe Becton; New, Megan Salon, Efraim Kaufmann; Santina Evans Received, thank you!       Previous Messages    ----- Message ----- From: Bobbye Morton, CMA Sent: 06/09/2023  10:06 AM EDT To: Penni Homans; Santina Evans; * Subject: Autopap                                        New orders have been placed for the above pt, DOB: Apr 29, 1981 Thanks

## 2023-06-11 DIAGNOSIS — I10 Essential (primary) hypertension: Secondary | ICD-10-CM | POA: Diagnosis not present

## 2023-06-11 DIAGNOSIS — Z0001 Encounter for general adult medical examination with abnormal findings: Secondary | ICD-10-CM | POA: Diagnosis not present

## 2023-06-11 DIAGNOSIS — F330 Major depressive disorder, recurrent, mild: Secondary | ICD-10-CM | POA: Diagnosis not present

## 2023-06-11 DIAGNOSIS — F909 Attention-deficit hyperactivity disorder, unspecified type: Secondary | ICD-10-CM | POA: Diagnosis not present

## 2023-06-16 DIAGNOSIS — D2272 Melanocytic nevi of left lower limb, including hip: Secondary | ICD-10-CM | POA: Diagnosis not present

## 2023-06-16 DIAGNOSIS — L821 Other seborrheic keratosis: Secondary | ICD-10-CM | POA: Diagnosis not present

## 2023-06-16 DIAGNOSIS — D225 Melanocytic nevi of trunk: Secondary | ICD-10-CM | POA: Diagnosis not present

## 2023-06-16 DIAGNOSIS — G4733 Obstructive sleep apnea (adult) (pediatric): Secondary | ICD-10-CM | POA: Diagnosis not present

## 2023-06-16 DIAGNOSIS — L812 Freckles: Secondary | ICD-10-CM | POA: Diagnosis not present

## 2023-06-17 DIAGNOSIS — Z0000 Encounter for general adult medical examination without abnormal findings: Secondary | ICD-10-CM | POA: Diagnosis not present

## 2023-06-17 DIAGNOSIS — I10 Essential (primary) hypertension: Secondary | ICD-10-CM | POA: Diagnosis not present

## 2023-06-17 DIAGNOSIS — Z1331 Encounter for screening for depression: Secondary | ICD-10-CM | POA: Diagnosis not present

## 2023-06-17 DIAGNOSIS — R400 Somnolence: Secondary | ICD-10-CM | POA: Diagnosis not present

## 2023-06-20 DIAGNOSIS — F330 Major depressive disorder, recurrent, mild: Secondary | ICD-10-CM | POA: Diagnosis not present

## 2023-06-20 DIAGNOSIS — F909 Attention-deficit hyperactivity disorder, unspecified type: Secondary | ICD-10-CM | POA: Diagnosis not present

## 2023-06-25 DIAGNOSIS — F330 Major depressive disorder, recurrent, mild: Secondary | ICD-10-CM | POA: Diagnosis not present

## 2023-06-25 DIAGNOSIS — G4733 Obstructive sleep apnea (adult) (pediatric): Secondary | ICD-10-CM | POA: Diagnosis not present

## 2023-06-25 DIAGNOSIS — F909 Attention-deficit hyperactivity disorder, unspecified type: Secondary | ICD-10-CM | POA: Diagnosis not present

## 2023-06-27 ENCOUNTER — Other Ambulatory Visit: Payer: Self-pay | Admitting: General Surgery

## 2023-06-27 DIAGNOSIS — Z809 Family history of malignant neoplasm, unspecified: Secondary | ICD-10-CM

## 2023-06-27 DIAGNOSIS — R928 Other abnormal and inconclusive findings on diagnostic imaging of breast: Secondary | ICD-10-CM

## 2023-07-02 DIAGNOSIS — F909 Attention-deficit hyperactivity disorder, unspecified type: Secondary | ICD-10-CM | POA: Diagnosis not present

## 2023-07-02 DIAGNOSIS — F330 Major depressive disorder, recurrent, mild: Secondary | ICD-10-CM | POA: Diagnosis not present

## 2023-07-09 DIAGNOSIS — F330 Major depressive disorder, recurrent, mild: Secondary | ICD-10-CM | POA: Diagnosis not present

## 2023-07-09 DIAGNOSIS — F909 Attention-deficit hyperactivity disorder, unspecified type: Secondary | ICD-10-CM | POA: Diagnosis not present

## 2023-07-17 DIAGNOSIS — G4733 Obstructive sleep apnea (adult) (pediatric): Secondary | ICD-10-CM | POA: Diagnosis not present

## 2023-08-16 DIAGNOSIS — G4733 Obstructive sleep apnea (adult) (pediatric): Secondary | ICD-10-CM | POA: Diagnosis not present

## 2023-08-26 ENCOUNTER — Ambulatory Visit (INDEPENDENT_AMBULATORY_CARE_PROVIDER_SITE_OTHER): Payer: BC Managed Care – PPO | Admitting: Adult Health

## 2023-08-26 ENCOUNTER — Encounter: Payer: Self-pay | Admitting: Adult Health

## 2023-08-26 VITALS — BP 130/90 | HR 78 | Ht 64.5 in | Wt 211.0 lb

## 2023-08-26 DIAGNOSIS — G4733 Obstructive sleep apnea (adult) (pediatric): Secondary | ICD-10-CM | POA: Diagnosis not present

## 2023-08-26 NOTE — Patient Instructions (Signed)
Continue using CPAP nightly and greater than 4 hours each night If your symptoms worsen or you develop new symptoms please let us know.

## 2023-08-27 DIAGNOSIS — Z130 Encounter for screening for diseases of the blood and blood-forming organs and certain disorders involving the immune mechanism: Secondary | ICD-10-CM | POA: Diagnosis not present

## 2023-08-27 DIAGNOSIS — Z01419 Encounter for gynecological examination (general) (routine) without abnormal findings: Secondary | ICD-10-CM | POA: Diagnosis not present

## 2023-08-27 DIAGNOSIS — R87810 Cervical high risk human papillomavirus (HPV) DNA test positive: Secondary | ICD-10-CM | POA: Diagnosis not present

## 2023-09-16 DIAGNOSIS — G4733 Obstructive sleep apnea (adult) (pediatric): Secondary | ICD-10-CM | POA: Diagnosis not present

## 2023-10-13 ENCOUNTER — Other Ambulatory Visit: Payer: Self-pay | Admitting: General Surgery

## 2023-10-13 DIAGNOSIS — R928 Other abnormal and inconclusive findings on diagnostic imaging of breast: Secondary | ICD-10-CM

## 2023-10-13 DIAGNOSIS — Z809 Family history of malignant neoplasm, unspecified: Secondary | ICD-10-CM

## 2023-12-18 ENCOUNTER — Ambulatory Visit
Admission: RE | Admit: 2023-12-18 | Discharge: 2023-12-18 | Disposition: A | Discharge status: Home / Self Care | Payer: Medicaid Other | Source: Ambulatory Visit | Attending: General Surgery | Admitting: General Surgery

## 2023-12-18 DIAGNOSIS — Z809 Family history of malignant neoplasm, unspecified: Secondary | ICD-10-CM

## 2023-12-18 DIAGNOSIS — R928 Other abnormal and inconclusive findings on diagnostic imaging of breast: Secondary | ICD-10-CM

## 2023-12-29 ENCOUNTER — Encounter: Payer: Self-pay | Admitting: General Surgery

## 2024-01-04 ENCOUNTER — Ambulatory Visit
Admission: RE | Admit: 2024-01-04 | Discharge: 2024-01-04 | Disposition: A | Discharge status: Home / Self Care | Payer: BC Managed Care – PPO | Source: Ambulatory Visit | Attending: General Surgery | Admitting: General Surgery

## 2024-01-04 DIAGNOSIS — Z809 Family history of malignant neoplasm, unspecified: Secondary | ICD-10-CM

## 2024-01-04 DIAGNOSIS — R928 Other abnormal and inconclusive findings on diagnostic imaging of breast: Secondary | ICD-10-CM

## 2024-01-04 MED ORDER — GADOPICLENOL 0.5 MMOL/ML IV SOLN
9.0000 mL | Freq: Once | INTRAVENOUS | Status: AC | PRN
  Administered 2024-01-04: 9 mL via INTRAVENOUS

## 2024-01-05 ENCOUNTER — Other Ambulatory Visit: Payer: Self-pay | Admitting: General Surgery

## 2024-01-05 DIAGNOSIS — R9389 Abnormal findings on diagnostic imaging of other specified body structures: Secondary | ICD-10-CM

## 2024-01-15 ENCOUNTER — Encounter: Payer: Self-pay | Admitting: General Surgery

## 2024-01-15 ENCOUNTER — Other Ambulatory Visit: Payer: Self-pay | Admitting: General Surgery

## 2024-01-15 ENCOUNTER — Ambulatory Visit
Admission: RE | Admit: 2024-01-15 | Discharge: 2024-01-15 | Disposition: A | Discharge status: Home / Self Care | Payer: Medicaid Other | Source: Ambulatory Visit | Attending: General Surgery | Admitting: General Surgery

## 2024-01-15 ENCOUNTER — Other Ambulatory Visit: Payer: Self-pay | Admitting: Body Imaging

## 2024-01-15 DIAGNOSIS — R9389 Abnormal findings on diagnostic imaging of other specified body structures: Secondary | ICD-10-CM

## 2024-01-16 LAB — SURGICAL PATHOLOGY

## 2024-03-12 ENCOUNTER — Other Ambulatory Visit: Payer: Self-pay | Admitting: Family Medicine

## 2024-03-12 DIAGNOSIS — R1010 Upper abdominal pain, unspecified: Secondary | ICD-10-CM

## 2024-03-16 ENCOUNTER — Ambulatory Visit
Admission: RE | Admit: 2024-03-16 | Discharge: 2024-03-16 | Discharge status: Home / Self Care | Source: Ambulatory Visit | Attending: Family Medicine | Admitting: Family Medicine

## 2024-03-16 DIAGNOSIS — R1010 Upper abdominal pain, unspecified: Secondary | ICD-10-CM

## 2024-03-30 ENCOUNTER — Ambulatory Visit: Payer: Self-pay | Admitting: Surgery

## 2024-03-30 NOTE — H&P (Signed)
 REFERRING PHYSICIAN:  Self   PROVIDER:  Kinnedy Mongiello [place] Kocher, MD   DATE OF ENCOUNTER: 03/30/2024   Subjective    Chief Complaint: New Consultation (Gallstones)       History of Present Illness:   Patient is referred by her primary care physician, Dr. Barnetta Liberty, for surgical evaluation and management of symptomatic cholelithiasis and probable chronic cholecystitis.  Patient has had intermittent symptoms dating back to November 2024.  She has had 3 discrete episodes of nausea, occasional emesis, and on 1 occasion profuse diarrhea associated with mild upper abdominal discomfort.  Patient denies any history of jaundice or acholic stools.  She has no previous history of hepatitis or pancreatitis.  Patient underwent ultrasound on Mar 16, 2024.  This demonstrated cholelithiasis with a large stone measuring 1.5 cm.  There was no wall thickening.  There was no biliary dilatation.  Previous abdominal surgery includes cesarean section on 2 occasions.  There is a family history of gallbladder disease in a maternal grandmother.  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     Review of Systems: A complete review of systems was obtained from the patient.  I have reviewed this information and discussed as appropriate with the patient.  See HPI as well for other ROS.   Review of Systems  Constitutional: Negative.   HENT: Negative.    Eyes: Negative.   Respiratory: Negative.    Cardiovascular: Negative.   Gastrointestinal:  Positive for abdominal pain, diarrhea, nausea and vomiting.  Genitourinary: Negative.  Negative for flank pain.  Musculoskeletal: Negative.   Skin: Negative.   Neurological:  Negative for tremors.  Endo/Heme/Allergies: Negative.   Psychiatric/Behavioral: Negative.          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        Past Surgical History       Past Surgical History:  Procedure Laterality Date   Radioactive Seed Guided excisional breast biopsy Left 11/20/2022    Dr Cherlynn Cornfield   CESAREAN SECTION       egg retrieval for IVF       LITHOTRIPSY       REPEAT CESAREAN SECTION            Allergies      Allergies  Allergen Reactions   Adhesive Itching and Rash   Codeine Nausea And Vomiting        Medications Ordered Prior to Encounter        Current Outpatient Medications on File Prior to Visit  Medication Sig Dispense Refill   DULoxetine  (CYMBALTA ) 60 MG DR capsule TAKE ONE CAPSULE EACH DAY       eletriptan  (RELPAX ) 40 MG tablet TAKE ONE TABLET EVERY DAY AS NEEDED FOR migraine       lisdexamfetamine (VYVANSE) 40 MG capsule Take 40 mg by mouth every morning       losartan (COZAAR) 50 MG tablet Take 50 mg by mouth at bedtime       ADDERALL XR 30 mg XR capsule 1 capsule in the morning Orally Once  a day for 30 days        No current facility-administered medications on file prior to visit.        Family History       Family History  Problem Relation Age of Onset   Skin cancer Father     High blood pressure (Hypertension) Maternal Grandmother     Breast cancer Paternal Grandmother     Deep vein thrombosis (DVT or abnormal blood clot formation) Paternal Grandfather          Tobacco Use History  Social History       Tobacco Use  Smoking Status Never  Smokeless Tobacco Never        Social History  Social History         Socioeconomic History   Marital status: Married  Tobacco Use   Smoking status: Never   Smokeless tobacco: Never  Substance and Sexual Activity   Alcohol  use: Yes      Comment: ocasional   Drug use: Never    Social Drivers of Health        Social Connections: Unknown  (06/12/2020)    Received from [facility], [facility]    Social Connections     Frequency of Communication with Friends and Family: Not asked     Frequency of Social Gatherings with Friends and Family: Not asked  Housing Stability: Not At Risk (01/18/2021)    Received from [facility], [facility]    Housing Stability     Was there a time when you did not have a steady place to sleep: Unrecognized value     Worried that the place you are staying is making you sick: Unrecognized value        Objective:         Vitals:    03/30/24 1332  PainSc: 0-No pain    There is no height or weight on file to calculate BMI.   Physical Exam    GENERAL APPEARANCE Comfortable, no acute issues Development: normal Gross deformities: none   SKIN Rash, lesions, ulcers: none Induration, erythema: none Nodules: none palpable   EYES Conjunctiva and lids: normal Pupils: equal   EARS, NOSE, MOUTH, THROAT External ears: no lesion or deformity External nose: no lesion or deformity Hearing: grossly normal   NECK Symmetric: yes Trachea: midline Thyroid : no palpable nodules in the thyroid  bed   CHEST/CV Not assessed   ABDOMEN Soft without distention.  Well-healed surgical incision left upper quadrant.  No hepatosplenomegaly.  No significant tenderness.  No masses.   GENITOURINARY/RECTAL Not assessed   MUSCULOSKELETAL Station and gait: normal Digits and nails: no clubbing or cyanosis Muscle strength: grossly normal all extremities Deformity: none   PSYCHIATRIC Oriented to person, place, and time: yes Mood and affect: normal for situation Judgment and insight: appropriate for situation       Assessment and Plan:  Diagnoses and all orders for this visit:   Calculus of gallbladder with chronic cholecystitis without obstruction     Patient is referred by her primary care physician for consideration for cholecystectomy for management of suspected chronic cholecystitis and  cholelithiasis.  Patient is provided with written literature on gallbladder surgery to review at home.   Today we reviewed her clinical history.  We did review her symptoms which have occurred with each of her 3 episodes dating back to November 2024.  We reviewed her ultrasound report.  We discussed proceeding with laparoscopic cholecystectomy with intraoperative cholangiography.  We discussed the  risk and benefits of surgery.  We discussed the possibility of conversion to open surgery.  We discussed the hospital stay to be anticipated and that likely this would be done as an outpatient surgical procedure.  We discussed her postoperative recovery and return to normal activities.  The patient understands and wishes to proceed with surgery in the near future.  Oralee Billow, MD [facility] practice Office: [phone]

## 2024-03-30 NOTE — H&P (View-Only) (Signed)
 REFERRING PHYSICIAN:  Self   PROVIDER:  Kinnedy Mongiello [place] Kocher, MD   DATE OF ENCOUNTER: 03/30/2024   Subjective    Chief Complaint: New Consultation (Gallstones)       History of Present Illness:   Patient is referred by her primary care physician, Dr. Barnetta Liberty, for surgical evaluation and management of symptomatic cholelithiasis and probable chronic cholecystitis.  Patient has had intermittent symptoms dating back to November 2024.  She has had 3 discrete episodes of nausea, occasional emesis, and on 1 occasion profuse diarrhea associated with mild upper abdominal discomfort.  Patient denies any history of jaundice or acholic stools.  She has no previous history of hepatitis or pancreatitis.  Patient underwent ultrasound on Mar 16, 2024.  This demonstrated cholelithiasis with a large stone measuring 1.5 cm.  There was no wall thickening.  There was no biliary dilatation.  Previous abdominal surgery includes cesarean section on 2 occasions.  There is a family history of gallbladder disease in a maternal grandmother.  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     Review of Systems: A complete review of systems was obtained from the patient.  I have reviewed this information and discussed as appropriate with the patient.  See HPI as well for other ROS.   Review of Systems  Constitutional: Negative.   HENT: Negative.    Eyes: Negative.   Respiratory: Negative.    Cardiovascular: Negative.   Gastrointestinal:  Positive for abdominal pain, diarrhea, nausea and vomiting.  Genitourinary: Negative.  Negative for flank pain.  Musculoskeletal: Negative.   Skin: Negative.   Neurological:  Negative for tremors.  Endo/Heme/Allergies: Negative.   Psychiatric/Behavioral: Negative.          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        Past Surgical History       Past Surgical History:  Procedure Laterality Date   Radioactive Seed Guided excisional breast biopsy Left 11/20/2022    Dr Cherlynn Cornfield   CESAREAN SECTION       egg retrieval for IVF       LITHOTRIPSY       REPEAT CESAREAN SECTION            Allergies      Allergies  Allergen Reactions   Adhesive Itching and Rash   Codeine Nausea And Vomiting        Medications Ordered Prior to Encounter        Current Outpatient Medications on File Prior to Visit  Medication Sig Dispense Refill   DULoxetine  (CYMBALTA ) 60 MG DR capsule TAKE ONE CAPSULE EACH DAY       eletriptan  (RELPAX ) 40 MG tablet TAKE ONE TABLET EVERY DAY AS NEEDED FOR migraine       lisdexamfetamine (VYVANSE) 40 MG capsule Take 40 mg by mouth every morning       losartan (COZAAR) 50 MG tablet Take 50 mg by mouth at bedtime       ADDERALL XR 30 mg XR capsule 1 capsule in the morning Orally Once  a day for 30 days        No current facility-administered medications on file prior to visit.        Family History       Family History  Problem Relation Age of Onset   Skin cancer Father     High blood pressure (Hypertension) Maternal Grandmother     Breast cancer Paternal Grandmother     Deep vein thrombosis (DVT or abnormal blood clot formation) Paternal Grandfather          Tobacco Use History  Social History       Tobacco Use  Smoking Status Never  Smokeless Tobacco Never        Social History  Social History         Socioeconomic History   Marital status: Married  Tobacco Use   Smoking status: Never   Smokeless tobacco: Never  Substance and Sexual Activity   Alcohol  use: Yes      Comment: ocasional   Drug use: Never    Social Drivers of Health        Social Connections: Unknown  (06/12/2020)    Received from [facility], [facility]    Social Connections     Frequency of Communication with Friends and Family: Not asked     Frequency of Social Gatherings with Friends and Family: Not asked  Housing Stability: Not At Risk (01/18/2021)    Received from [facility], [facility]    Housing Stability     Was there a time when you did not have a steady place to sleep: Unrecognized value     Worried that the place you are staying is making you sick: Unrecognized value        Objective:         Vitals:    03/30/24 1332  PainSc: 0-No pain    There is no height or weight on file to calculate BMI.   Physical Exam    GENERAL APPEARANCE Comfortable, no acute issues Development: normal Gross deformities: none   SKIN Rash, lesions, ulcers: none Induration, erythema: none Nodules: none palpable   EYES Conjunctiva and lids: normal Pupils: equal   EARS, NOSE, MOUTH, THROAT External ears: no lesion or deformity External nose: no lesion or deformity Hearing: grossly normal   NECK Symmetric: yes Trachea: midline Thyroid : no palpable nodules in the thyroid  bed   CHEST/CV Not assessed   ABDOMEN Soft without distention.  Well-healed surgical incision left upper quadrant.  No hepatosplenomegaly.  No significant tenderness.  No masses.   GENITOURINARY/RECTAL Not assessed   MUSCULOSKELETAL Station and gait: normal Digits and nails: no clubbing or cyanosis Muscle strength: grossly normal all extremities Deformity: none   PSYCHIATRIC Oriented to person, place, and time: yes Mood and affect: normal for situation Judgment and insight: appropriate for situation       Assessment and Plan:  Diagnoses and all orders for this visit:   Calculus of gallbladder with chronic cholecystitis without obstruction     Patient is referred by her primary care physician for consideration for cholecystectomy for management of suspected chronic cholecystitis and  cholelithiasis.  Patient is provided with written literature on gallbladder surgery to review at home.   Today we reviewed her clinical history.  We did review her symptoms which have occurred with each of her 3 episodes dating back to November 2024.  We reviewed her ultrasound report.  We discussed proceeding with laparoscopic cholecystectomy with intraoperative cholangiography.  We discussed the  risk and benefits of surgery.  We discussed the possibility of conversion to open surgery.  We discussed the hospital stay to be anticipated and that likely this would be done as an outpatient surgical procedure.  We discussed her postoperative recovery and return to normal activities.  The patient understands and wishes to proceed with surgery in the near future.  Oralee Billow, MD [facility] practice Office: [phone]

## 2024-03-31 NOTE — Patient Instructions (Addendum)
 SURGICAL WAITING ROOM VISITATION Patients having surgery or a procedure may have no more than 2 support people in the waiting area - these visitors may rotate in the visitor waiting room.   If the patient needs to stay at the hospital during part of their recovery, the visitor guidelines for inpatient rooms apply.  PRE-OP VISITATION  Pre-op nurse will coordinate an appropriate time for 1 support person to accompany the patient in pre-op.  This support person may not rotate.  This visitor will be contacted when the time is appropriate for the visitor to come back in the pre-op area.  Please refer to the [facility] website for the visitor guidelines for Inpatients (after your surgery is over and you are in a regular room).  You are not required to quarantine at this time prior to your surgery. However, you must do this: Hand Hygiene often Do NOT share personal items Notify your provider if you are in close contact with someone who has COVID or you develop fever 100.4 or greater, new onset of sneezing, cough, sore throat, shortness of breath or body aches.  If you test positive for Covid or have been in contact with anyone that has tested positive in the last 10 days please notify you surgeon.    Your procedure is scheduled on:  Friday  Apr 02, 2024  Report to [facility] Main Entrance: [address] entrance where the [employer] is available.   Report to admitting at: 07:15  AM  Call this number if you have any questions or problems the morning of surgery [phone]  FOLLOW ANY ADDITIONAL PRE OP INSTRUCTIONS YOU RECEIVED FROM YOUR SURGEON'S OFFICE!!!  Do not eat food after Midnight the night prior to your surgery/procedure.  After Midnight you may have the following liquids until  06:30 AM DAY OF SURGERY  Clear Liquid Diet Water Black Coffee (sugar ok, NO MILK/CREAM OR CREAMERS)  Tea (sugar ok, NO MILK/CREAM OR CREAMERS) regular and decaf                             Plain  Jell-O  with no fruit (NO RED)                                           Fruit ices (not with fruit pulp, NO RED)                                     Popsicles (NO RED)                                                                  Juice: NO CITRUS JUICES: only apple, WHITE grape, WHITE cranberry Sports drinks like Gatorade or Powerade (NO RED)              Oral Hygiene is also important to reduce your risk of infection.        Remember - BRUSH YOUR TEETH THE MORNING OF SURGERY WITH YOUR REGULAR TOOTHPASTE  Do NOT smoke after Midnight the night  before surgery.  STOP TAKING all Vitamins, Herbs and supplements 1 week before your surgery.   Take ONLY these medicines the morning of surgery with A SIP OF WATER: none                    You may not have any metal on your body including hair pins, jewelry, and body piercing  Do not wear make-up, lotions, powders, perfumes or deodorant  Do not wear nail polish including gel and S&S, artificial / acrylic nails, or any other type of covering on natural nails including finger and toenails. If you have artificial nails, gel coating, etc., that needs to be removed by a nail salon, Please have this removed prior to surgery. Not doing so may mean that your surgery could be cancelled or delayed if the Surgeon or anesthesia staff feels like they are unable to monitor you safely.   Do not shave 48 hours prior to surgery to avoid nicks in your skin which may contribute to postoperative infections.   Contacts, Hearing Aids, dentures or bridgework may not be worn into surgery. DENTURES WILL BE REMOVED PRIOR TO SURGERY PLEASE DO NOT APPLY "Poly grip" OR ADHESIVES!!!  Patients discharged on the day of surgery will not be allowed to drive home.  Someone NEEDS to stay with you for the first 24 hours after anesthesia.  Do not bring your home medications to the hospital. The Pharmacy will dispense medications listed on your medication list to you during your  admission in the Hospital.  Special Instructions: Bring a copy of your healthcare power of attorney and living will documents the day of surgery, if you wish to have them scanned into your [facility]- EPIC  Please read over the following fact sheets you were given: IF YOU HAVE QUESTIONS ABOUT YOUR PRE-OP INSTRUCTIONS, PLEASE CALL [phone]   [facility] - Preparing for Surgery Before surgery, you can play an important role.  Because skin is not sterile, your skin needs to be as free of germs as possible.  You can reduce the number of germs on your skin by washing with CHG (chlorahexidine gluconate) soap before surgery.  CHG is an antiseptic cleaner which kills germs and bonds with the skin to continue killing germs even after washing. Please DO NOT use if you have an allergy to CHG or antibacterial soaps.  If your skin becomes reddened/irritated stop using the CHG and inform your nurse when you arrive at Short Stay. Do not shave (including legs and underarms) for at least 48 hours prior to the first CHG shower.  You may shave your face/neck.  Please follow these instructions carefully:  1.  Shower with CHG Soap the night before surgery and the  morning of surgery.  2.  If you choose to wash your hair, wash your hair first as usual with your normal  shampoo.  3.  After you shampoo, rinse your hair and body thoroughly to remove the shampoo.                             4.  Use CHG as you would any other liquid soap.  You can apply chg directly to the skin and wash.  Gently with a scrungie or clean washcloth.  5.  Apply the CHG Soap to your body ONLY FROM THE NECK DOWN.   Do not use on face/ open  Wound or open sores. Avoid contact with eyes, ears mouth and genitals (private parts).                       Wash face,  Genitals (private parts) with your normal soap.             6.  Wash thoroughly, paying special attention to the area where your  surgery  will  be performed.  7.  Thoroughly rinse your body with warm water from the neck down.  8.  DO NOT shower/wash with your normal soap after using and rinsing off the CHG Soap.            9.  Pat yourself dry with a clean towel.            10.  Wear clean pajamas.            11.  Place clean sheets on your bed the night of your first shower and do not  sleep with pets.  ON THE DAY OF SURGERY : Do not apply any lotions/deodorants the morning of surgery.  Please wear clean clothes to the hospital/surgery center.     FAILURE TO FOLLOW THESE INSTRUCTIONS MAY RESULT IN THE CANCELLATION OF YOUR SURGERY  PATIENT SIGNATURE_________________________________  NURSE SIGNATURE__________________________________  ________________________________________________________________________

## 2024-03-31 NOTE — Progress Notes (Addendum)
 COVID Vaccine received:  []  No [x]  Yes Date of any COVID positive Test in last 90 days: none  PCP - Barnetta Liberty, MD  Cardiologist -   Chest x-ray -  EKG -  11-19-2022  Epic   will repeat  04-01-2024 Stress Test -  ECHO -  Cardiac Cath -   Bowel Prep - [x]  No  []   Yes ______  Pacemaker / ICD device [x]  No []  Yes   Spinal Cord Stimulator:[x]  No []  Yes       History of Sleep Apnea? []  No [x]  Yes    study 05-21-23 CPAP used?- [x]  No []  Yes    Does the patient monitor blood sugar?   [x]  N/A   []  No []  Yes  Patient has: [x]  NO Hx DM   []  Pre-DM   []  DM1  []   DM2  Blood Thinner / Instructions:  none Aspirin Instructions:  none  ERAS Protocol Ordered: []  No  [x]  Yes PRE-SURGERY []  ENSURE  []  G2   [x]  No Drink Ordered Patient is to be NPO after: 0630  Dental hx: []  Dentures:  [x]  N/A      []  Bridge or Partial:     Has Permanent retainer               []  Loose or Damaged teeth:   Activity level: Patient is able to climb a flight of stairs without difficulty; [x]  No CP  but would have _SOB  Patient can perform ADLs without assistance.   Anesthesia review: s/p recent bilateral breast bx w/ clip placement.  ADHD, depression, GERD, migraines, remote PACs w/ caffeine  Patient denies shortness of breath, fever, cough and chest pain at PAT appointment.  Patient verbalized understanding and agreement to the Pre-Surgical Instructions that were given to them at this PAT appointment. Patient was also educated of the need to review these PAT instructions again prior to her surgery.I reviewed the appropriate phone numbers to call if they have any and questions or concerns.

## 2024-04-01 ENCOUNTER — Other Ambulatory Visit: Payer: Self-pay

## 2024-04-01 ENCOUNTER — Encounter (HOSPITAL_COMMUNITY)
Admission: RE | Admit: 2024-04-01 | Discharge: 2024-04-01 | Disposition: A | Discharge status: Home / Self Care | Source: Ambulatory Visit | Attending: Surgery | Admitting: Surgery

## 2024-04-01 ENCOUNTER — Encounter (HOSPITAL_COMMUNITY): Payer: Self-pay

## 2024-04-01 VITALS — BP 123/80 | HR 80 | Temp 97.9°F | Resp 20 | Ht 64.5 in | Wt 209.0 lb

## 2024-04-01 DIAGNOSIS — Z79899 Other long term (current) drug therapy: Secondary | ICD-10-CM | POA: Insufficient documentation

## 2024-04-01 DIAGNOSIS — Z01818 Encounter for other preprocedural examination: Secondary | ICD-10-CM | POA: Insufficient documentation

## 2024-04-01 HISTORY — DX: Other complications of anesthesia, initial encounter: T88.59XA

## 2024-04-01 HISTORY — DX: Nausea with vomiting, unspecified: R11.2

## 2024-04-01 HISTORY — DX: Other specified postprocedural states: Z98.890

## 2024-04-01 LAB — CBC
HCT: 39.2 % (ref 36.0–46.0)
Hemoglobin: 12 g/dL (ref 12.0–15.0)
MCH: 25.1 pg — ABNORMAL LOW (ref 26.0–34.0)
MCHC: 30.6 g/dL (ref 30.0–36.0)
MCV: 82 fL (ref 80.0–100.0)
Platelets: 379 10*3/uL (ref 150–400)
RBC: 4.78 MIL/uL (ref 3.87–5.11)
RDW: 14.3 % (ref 11.5–15.5)
WBC: 5.7 10*3/uL (ref 4.0–10.5)
nRBC: 0 % (ref 0.0–0.2)

## 2024-04-01 LAB — COMPREHENSIVE METABOLIC PANEL WITH GFR
ALT: 16 U/L (ref 0–44)
AST: 18 U/L (ref 15–41)
Albumin: 3.8 g/dL (ref 3.5–5.0)
Alkaline Phosphatase: 69 U/L (ref 38–126)
Anion gap: 7 (ref 5–15)
BUN: 18 mg/dL (ref 6–20)
CO2: 26 mmol/L (ref 22–32)
Calcium: 9.1 mg/dL (ref 8.9–10.3)
Chloride: 103 mmol/L (ref 98–111)
Creatinine, Ser: 0.52 mg/dL (ref 0.44–1.00)
GFR, Estimated: >60 mL/min (ref >60)
Glucose, Bld: 102 mg/dL — ABNORMAL HIGH (ref 70–99)
Potassium: 3.7 mmol/L (ref 3.5–5.1)
Sodium: 136 mmol/L (ref 135–145)
Total Bilirubin: 0.5 mg/dL (ref 0.0–1.2)
Total Protein: 7.5 g/dL (ref 6.5–8.1)

## 2024-04-02 ENCOUNTER — Ambulatory Visit (HOSPITAL_COMMUNITY)
Admission: RE | Admit: 2024-04-02 | Discharge: 2024-04-02 | Disposition: A | Discharge status: Home / Self Care | Attending: Surgery | Admitting: Surgery

## 2024-04-02 ENCOUNTER — Ambulatory Visit (HOSPITAL_COMMUNITY): Admitting: Certified Registered Nurse Anesthetist

## 2024-04-02 ENCOUNTER — Ambulatory Visit (HOSPITAL_COMMUNITY)

## 2024-04-02 ENCOUNTER — Encounter (HOSPITAL_COMMUNITY): Payer: Self-pay | Admitting: Surgery

## 2024-04-02 ENCOUNTER — Other Ambulatory Visit: Payer: Self-pay

## 2024-04-02 ENCOUNTER — Encounter (HOSPITAL_COMMUNITY)
Admission: RE | Disposition: A | Discharge status: Home / Self Care | Payer: Self-pay | Source: Home / Self Care | Attending: Surgery

## 2024-04-02 DIAGNOSIS — G4730 Sleep apnea, unspecified: Secondary | ICD-10-CM | POA: Insufficient documentation

## 2024-04-02 DIAGNOSIS — I10 Essential (primary) hypertension: Secondary | ICD-10-CM | POA: Diagnosis not present

## 2024-04-02 DIAGNOSIS — K8010 Calculus of gallbladder with chronic cholecystitis without obstruction: Secondary | ICD-10-CM | POA: Diagnosis present

## 2024-04-02 DIAGNOSIS — K219 Gastro-esophageal reflux disease without esophagitis: Secondary | ICD-10-CM | POA: Diagnosis not present

## 2024-04-02 HISTORY — PX: CHOLECYSTECTOMY: SHX55

## 2024-04-02 LAB — POCT PREGNANCY, URINE: Preg Test, Ur: NEGATIVE

## 2024-04-02 SURGERY — LAPAROSCOPIC CHOLECYSTECTOMY WITH INTRAOPERATIVE CHOLANGIOGRAM
Anesthesia: General | Site: Abdomen

## 2024-04-02 MED ORDER — DEXAMETHASONE SODIUM PHOSPHATE 10 MG/ML IJ SOLN
INTRAMUSCULAR | Status: DC | PRN
Start: 2024-04-02 — End: 2024-04-02
  Administered 2024-04-02: 10 mg via INTRAVENOUS

## 2024-04-02 MED ORDER — PROPOFOL 10 MG/ML IV BOLUS
INTRAVENOUS | Status: DC | PRN
  Administered 2024-04-02: 150 mg via INTRAVENOUS
  Administered 2024-04-02: 200 ug/kg/min via INTRAVENOUS

## 2024-04-02 MED ORDER — PHENYLEPHRINE HCL-NACL 20-0.9 MG/250ML-% IV SOLN
INTRAVENOUS | Status: DC | PRN
  Administered 2024-04-02 (×2): 80 ug via INTRAVENOUS

## 2024-04-02 MED ORDER — ONDANSETRON HCL 4 MG/2ML IJ SOLN
INTRAMUSCULAR | Status: AC
  Filled 2024-04-02: qty 2

## 2024-04-02 MED ORDER — OXYCODONE HCL 5 MG/5ML PO SOLN: 5.0000 mg | Freq: Once | ORAL | Status: DC | PRN

## 2024-04-02 MED ORDER — LIDOCAINE HCL 1 % IJ SOLN
INTRAMUSCULAR | Status: AC
  Filled 2024-04-02: qty 20

## 2024-04-02 MED ORDER — FENTANYL CITRATE PF 50 MCG/ML IJ SOSY
25.0000 ug | PREFILLED_SYRINGE | INTRAMUSCULAR | Status: DC | PRN
  Administered 2024-04-02 (×2): 50 ug via INTRAVENOUS

## 2024-04-02 MED ORDER — ROCURONIUM BROMIDE 10 MG/ML (PF) SYRINGE
PREFILLED_SYRINGE | INTRAVENOUS | Status: DC | PRN
  Administered 2024-04-02: 80 mg via INTRAVENOUS

## 2024-04-02 MED ORDER — IOHEXOL 300 MG/ML  SOLN
INTRAMUSCULAR | Status: DC | PRN
  Administered 2024-04-02: 7 mL

## 2024-04-02 MED ORDER — CHLORHEXIDINE GLUCONATE CLOTH 2 % EX PADS: 6.0000 | MEDICATED_PAD | Freq: Once | CUTANEOUS | Status: DC

## 2024-04-02 MED ORDER — CHLORHEXIDINE GLUCONATE 0.12 % MT SOLN
15.0000 mL | Freq: Once | OROMUCOSAL | Status: AC
  Administered 2024-04-02: 15 mL via OROMUCOSAL

## 2024-04-02 MED ORDER — FENTANYL CITRATE PF 50 MCG/ML IJ SOSY
PREFILLED_SYRINGE | INTRAMUSCULAR | Status: AC
  Filled 2024-04-02: qty 2

## 2024-04-02 MED ORDER — BUPIVACAINE HCL 0.5 % IJ SOLN
INTRAMUSCULAR | Status: DC | PRN
  Administered 2024-04-02: 30 mL

## 2024-04-02 MED ORDER — OXYCODONE HCL 5 MG PO TABS: 5.0000 mg | ORAL_TABLET | Freq: Once | ORAL | Status: DC | PRN

## 2024-04-02 MED ORDER — TRAMADOL HCL 50 MG PO TABS: 50.0000 mg | ORAL_TABLET | Freq: Four times a day (QID) | ORAL | 0 refills | Status: DC | PRN

## 2024-04-02 MED ORDER — FENTANYL CITRATE (PF) 100 MCG/2ML IJ SOLN
INTRAMUSCULAR | Status: AC
  Filled 2024-04-02: qty 2

## 2024-04-02 MED ORDER — MIDAZOLAM HCL 2 MG/2ML IJ SOLN
INTRAMUSCULAR | Status: AC
Start: 2024-04-02 — End: ?
  Filled 2024-04-02: qty 2

## 2024-04-02 MED ORDER — PROPOFOL 1000 MG/100ML IV EMUL
INTRAVENOUS | Status: AC
  Filled 2024-04-02: qty 200

## 2024-04-02 MED ORDER — SUGAMMADEX SODIUM 200 MG/2ML IV SOLN
INTRAVENOUS | Status: DC | PRN
  Administered 2024-04-02 (×2): 100 mg via INTRAVENOUS

## 2024-04-02 MED ORDER — FENTANYL CITRATE (PF) 100 MCG/2ML IJ SOLN
INTRAMUSCULAR | Status: DC | PRN
  Administered 2024-04-02: 25 ug via INTRAVENOUS
  Administered 2024-04-02: 12.5 ug via INTRAVENOUS
  Administered 2024-04-02: 100 ug via INTRAVENOUS

## 2024-04-02 MED ORDER — BUPIVACAINE HCL (PF) 0.25 % IJ SOLN
INTRAMUSCULAR | Status: AC
  Filled 2024-04-02: qty 30

## 2024-04-02 MED ORDER — DROPERIDOL 2.5 MG/ML IJ SOLN: 0.6250 mg | Freq: Once | INTRAMUSCULAR | Status: DC | PRN

## 2024-04-02 MED ORDER — DEXAMETHASONE SODIUM PHOSPHATE 10 MG/ML IJ SOLN
INTRAMUSCULAR | Status: AC
  Filled 2024-04-02: qty 1

## 2024-04-02 MED ORDER — LACTATED RINGERS IV SOLN: INTRAVENOUS | Status: DC

## 2024-04-02 MED ORDER — ORAL CARE MOUTH RINSE: 15.0000 mL | Freq: Once | OROMUCOSAL | Status: AC

## 2024-04-02 MED ORDER — ACETAMINOPHEN 10 MG/ML IV SOLN: 1000.0000 mg | Freq: Once | INTRAVENOUS | Status: DC | PRN

## 2024-04-02 MED ORDER — CIPROFLOXACIN IN D5W 400 MG/200ML IV SOLN
400.0000 mg | INTRAVENOUS | Status: AC
  Administered 2024-04-02: 400 mg via INTRAVENOUS
  Filled 2024-04-02: qty 200

## 2024-04-02 MED ORDER — PHENYLEPHRINE 80 MCG/ML (10ML) SYRINGE FOR IV PUSH (FOR BLOOD PRESSURE SUPPORT)
PREFILLED_SYRINGE | INTRAVENOUS | Status: AC
  Filled 2024-04-02: qty 10

## 2024-04-02 MED ORDER — BUPIVACAINE HCL (PF) 0.5 % IJ SOLN
INTRAMUSCULAR | Status: AC
  Filled 2024-04-02: qty 30

## 2024-04-02 MED ORDER — ONDANSETRON HCL 4 MG/2ML IJ SOLN
INTRAMUSCULAR | Status: DC | PRN
  Administered 2024-04-02: 4 mg via INTRAVENOUS

## 2024-04-02 MED ORDER — ROCURONIUM BROMIDE 10 MG/ML (PF) SYRINGE
PREFILLED_SYRINGE | INTRAVENOUS | Status: AC
  Filled 2024-04-02: qty 10

## 2024-04-02 MED ORDER — MIDAZOLAM HCL 2 MG/2ML IJ SOLN
INTRAMUSCULAR | Status: DC | PRN
Start: 2024-04-02 — End: 2024-04-02
  Administered 2024-04-02 (×2): 1 mg via INTRAVENOUS

## 2024-04-02 MED ORDER — LIDOCAINE HCL (PF) 2 % IJ SOLN
INTRAMUSCULAR | Status: DC | PRN
Start: 2024-04-02 — End: 2024-04-02
  Administered 2024-04-02: 100 mg via INTRADERMAL

## 2024-04-02 SURGICAL SUPPLY — 29 items
BAG COUNTER SPONGE SURGICOUNT (BAG) IMPLANT
CABLE HIGH FREQUENCY MONO STRZ (ELECTRODE) ×1 IMPLANT
CHLORAPREP W/TINT 26 (MISCELLANEOUS) ×2 IMPLANT
CLIP APPLIE ROT 10 11.4 M/L (STAPLE) ×1 IMPLANT
COVER MAYO STAND XLG (MISCELLANEOUS) ×1 IMPLANT
COVER SURGICAL LIGHT HANDLE (MISCELLANEOUS) ×1 IMPLANT
DERMABOND ADVANCED .7 DNX12 (GAUZE/BANDAGES/DRESSINGS) IMPLANT
DRAPE C-ARM 42X120 X-RAY (DRAPES) ×1 IMPLANT
ELECT REM PT RETURN 15FT ADLT (MISCELLANEOUS) ×1 IMPLANT
GAUZE SPONGE 2X2 8PLY STRL LF (GAUZE/BANDAGES/DRESSINGS) ×1 IMPLANT
GLOVE SURG ORTHO 8.0 STRL STRW (GLOVE) ×1 IMPLANT
GLOVE SURG SYN 7.5 E (GLOVE) ×2 IMPLANT
GLOVE SURG SYN 7.5 PF PI (GLOVE) ×2 IMPLANT
GOWN STRL REUS W/ TWL XL LVL3 (GOWN DISPOSABLE) ×1 IMPLANT
IRRIGATION SUCT STRKRFLW 2 WTP (MISCELLANEOUS) ×1 IMPLANT
KIT BASIN OR (CUSTOM PROCEDURE TRAY) ×1 IMPLANT
KIT TURNOVER KIT A (KITS) IMPLANT
SCISSORS LAP 5X35 DISP (ENDOMECHANICALS) ×1 IMPLANT
SET CHOLANGIOGRAPH MIX (MISCELLANEOUS) ×1 IMPLANT
SET TUBE SMOKE EVAC HIGH FLOW (TUBING) ×1 IMPLANT
SLEEVE Z-THREAD 5X100MM (TROCAR) ×1 IMPLANT
SPIKE FLUID TRANSFER (MISCELLANEOUS) ×1 IMPLANT
SUT MNCRL AB 4-0 PS2 18 (SUTURE) ×1 IMPLANT
SYSTEM BAG RETRIEVAL 10MM (BASKET) ×1 IMPLANT
TOWEL OR 17X26 10 PK STRL BLUE (TOWEL DISPOSABLE) ×1 IMPLANT
TRAY LAPAROSCOPIC (CUSTOM PROCEDURE TRAY) ×1 IMPLANT
TROCAR 11X100 Z THREAD (TROCAR) ×1 IMPLANT
TROCAR BALLN 12MMX100 BLUNT (TROCAR) ×1 IMPLANT
TROCAR Z-THREAD OPTICAL 5X100M (TROCAR) ×1 IMPLANT

## 2024-04-02 NOTE — Anesthesia Procedure Notes (Signed)
 Procedure Name: Intubation Date/Time: 04/02/2024 9:31 AM  Performed by: Manuela Sella, CRNAPre-anesthesia Checklist: Patient identified, Emergency Drugs available, Suction available and Patient being monitored Patient Re-evaluated:Patient Re-evaluated prior to induction Oxygen Delivery Method: Circle system utilized Preoxygenation: Pre-oxygenation with 100% oxygen Induction Type: IV induction Ventilation: Mask ventilation without difficulty Laryngoscope Size: Mac and 3 Grade View: Grade II Tube type: Oral Tube size: 7.0 mm Number of attempts: 1 Airway Equipment and Method: Stylet Placement Confirmation: ETT inserted through vocal cords under direct vision, positive ETCO2 and breath sounds checked- equal and bilateral Secured at: 21 cm Tube secured with: Tape Dental Injury: Teeth and Oropharynx as per pre-operative assessment

## 2024-04-02 NOTE — Op Note (Signed)
 Procedure Note  Pre-operative Diagnosis:  chronic cholecystitis, cholelithiasis  Post-operative Diagnosis:  same  Surgeon:  Oralee Billow, MD  Assistant:  none   Procedure:  Laparoscopic cholecystectomy with intra-operative cholangiography  Anesthesia:  General  Estimated Blood Loss:  minimal  Drains: none         Specimen: gallbladder to pathology  Indications:  Patient is referred by her primary care physician, Dr. Barnetta Liberty, for surgical evaluation and management of symptomatic cholelithiasis and probable chronic cholecystitis. Patient has had intermittent symptoms dating back to November 2024. She has had 3 discrete episodes of nausea, occasional emesis, and on 1 occasion profuse diarrhea associated with mild upper abdominal discomfort. Patient denies any history of jaundice or acholic stools. She has no previous history of hepatitis or pancreatitis. Patient underwent ultrasound on Mar 16, 2024. This demonstrated cholelithiasis with a large stone measuring 1.5 cm. There was no wall thickening. There was no biliary dilatation. Previous abdominal surgery includes cesarean section on 2 occasions. There is a family history of gallbladder disease in a maternal grandmother. 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   Procedure description: The patient was seen in the pre-op holding area. The risks, benefits, complications, treatment options, and expected outcomes were previously discussed with the patient. The patient agreed with the proposed plan and has signed the informed consent form.  The patient was transported to operating room #3 at the [facility]. The patient was placed in the supine position on the operating room table. Following induction of general anesthesia, the abdomen was prepped and draped in the usual aseptic fashion.  An incision was made in the skin near the umbilicus. The midline fascia was incised and the peritoneal  cavity was entered and a Hasson cannula was introduced under direct vision. The cannula was secured with a 0-Vicryl pursestring suture. Pneumoperitoneum was established with carbon dioxide. Additional cannulae were introduced under direct vision along the right costal margin in the midline, mid-clavicular line, and anterior axillary line.   The gallbladder was identified and the fundus grasped and retracted cephalad. Adhesions were taken down bluntly and the electrocautery was utilized as needed, taking care not to involve any adjacent structures. The infundibulum was grasped and retracted laterally, exposing the peritoneum overlying the triangle of Calot. The peritoneum was incised and structures exposed with blunt dissection. The cystic duct was clearly identified, bluntly dissected circumferentially, and clipped at the neck of the gallbladder.  An incision was made in the cystic duct and the cholangiogram catheter introduced. The catheter was secured using an ligaclip.  Real-time cholangiography was performed using C-arm fluoroscopy.  There was rapid filling of a normal caliber common bile duct.  There was reflux of contrast into the left and right hepatic ductal systems.  There was free flow distally into the duodenum without filling defect or obstruction.  The catheter was removed from the peritoneal cavity.  The cystic duct was then ligated with ligaclips and divided. The cystic artery was identified, dissected circumferentially, ligated with ligaclips, and divided.  The gallbladder was dissected away from the gallbladder bed using the electrocautery for hemostasis. The gallbladder was completely removed from the liver and placed into an endocatch bag. The gallbladder was removed in the endocatch bag through the umbilical port site and submitted to pathology for review.  The right upper quadrant was irrigated and the gallbladder bed was inspected. Hemostasis was achieved with the  electrocautery.  Cannulae were removed under direct vision  and good hemostasis was noted. Pneumoperitoneum was released and the majority of the carbon dioxide evacuated. The umbilical wound was irrigated and the fascia was then closed with the pursestring suture.  Local anesthetic was infiltrated at all port sites. Skin incisions were closed with 4-0 Monocril subcuticular sutures and Dermabond was applied.  Instrument, sponge, and needle counts were correct at the conclusion of the case.  The patient was awakened from anesthesia and brought to the recovery room in stable condition.  The patient tolerated the procedure well.   Oralee Billow, MD [facility] Office: [phone]

## 2024-04-02 NOTE — Anesthesia Postprocedure Evaluation (Signed)
 Anesthesia Post Note  Patient: Rebecca Armstrong  Procedure(s) Performed: LAPAROSCOPIC CHOLECYSTECTOMY WITH INTRAOPERATIVE CHOLANGIOGRAM (Abdomen)     Patient location during evaluation: PACU Anesthesia Type: General Level of consciousness: awake and alert Pain management: pain level controlled Vital Signs Assessment: post-procedure vital signs reviewed and stable Respiratory status: spontaneous breathing, nonlabored ventilation, respiratory function stable and patient connected to nasal cannula oxygen Cardiovascular status: blood pressure returned to baseline and stable Postop Assessment: no apparent nausea or vomiting Anesthetic complications: no   No notable events documented.  Last Vitals:  Vitals:   04/02/24 1045 04/02/24 1115  BP: (!) 138/110 117/77  Pulse: 72 71  Resp: 17 14  Temp: (!) 36.3 C   SpO2: 100% 100%    Last Pain:  Vitals:   04/02/24 1115  TempSrc:   PainSc: Asleep                 Lethaniel Rave

## 2024-04-02 NOTE — Anesthesia Preprocedure Evaluation (Addendum)
 Anesthesia Evaluation  Patient identified by MRN, date of birth, ID band Patient awake    Reviewed: Allergy & Precautions, H&P , NPO status , Patient's Chart, lab work & pertinent test results  History of Anesthesia Complications Negative for: history of anesthetic complications  Airway Mallampati: II  TM Distance: >3 FB Neck ROM: Full    Dental no notable dental hx.    Pulmonary neg pulmonary ROS   Pulmonary exam normal breath sounds clear to auscultation       Cardiovascular hypertension, (-) angina (-) Past MI Normal cardiovascular exam Rhythm:Regular Rate:Normal     Neuro/Psych  Headaches, neg Seizures PSYCHIATRIC DISORDERS  Depression       GI/Hepatic Neg liver ROS,GERD  ,,Chronic cholecystitis    Endo/Other  negative endocrine ROS    Renal/GU Hx of stones  negative genitourinary   Musculoskeletal negative musculoskeletal ROS (+)    Abdominal   Peds negative pediatric ROS (+)  Hematology negative hematology ROS (+)   Anesthesia Other Findings   Reproductive/Obstetrics negative OB ROS                             Anesthesia Physical Anesthesia Plan  ASA: 2  Anesthesia Plan: General   Post-op Pain Management:    Induction: Intravenous  PONV Risk Score and Plan: 4 or greater and Ondansetron , Dexamethasone , Midazolam  and Treatment may vary due to age or medical condition  Airway Management Planned: Oral ETT  Additional Equipment:   Intra-op Plan:   Post-operative Plan: Extubation in OR  Informed Consent: I have reviewed the patients History and Physical, chart, labs and discussed the procedure including the risks, benefits and alternatives for the proposed anesthesia with the patient or authorized representative who has indicated his/her understanding and acceptance.     Dental advisory given  Plan Discussed with: CRNA  Anesthesia Plan Comments:         Anesthesia Quick Evaluation

## 2024-04-02 NOTE — Interval H&P Note (Signed)
 History and Physical Interval Note:  04/02/2024 8:54 AM  Rebecca Armstrong  has presented today for surgery, with the diagnosis of CHRONIC CHOLECYSTITIS, CHOLETITHIASIS.  The various methods of treatment have been discussed with the patient and family. After consideration of risks, benefits and other options for treatment, the patient has consented to    Procedure(s): LAPAROSCOPIC CHOLECYSTECTOMY WITH INTRAOPERATIVE CHOLANGIOGRAM (N/A) as a surgical intervention.    The patient's history has been reviewed, patient examined, no change in status, stable for surgery.  I have reviewed the patient's chart and labs.  Questions were answered to the patient's satisfaction.    Oralee Billow, MD [facility] practice Office: [phone]   Oralee Billow

## 2024-04-02 NOTE — Discharge Instructions (Addendum)
 [facility]  LAPAROSCOPIC SURGERY:  POST-OP INSTRUCTIONS  Always review your discharge instruction sheet given to you by the facility where your surgery was performed.  A prescription for pain medication may be given to you upon discharge.  Take your pain medication as prescribed.  If narcotic pain medicine is not needed, then you may take acetaminophen (Tylenol) or ibuprofen (Advil) as needed.  Take your usually prescribed medications unless otherwise directed.  If you need a refill on your pain medication, please contact your pharmacy.  They will contact our office to request authorization. Prescriptions will not be filled after 5 P.M. or on weekends.  You should follow a light diet the first few days after arrival home, such as soup and crackers or toast.  Be sure to include plenty of fluids daily.  Most patients will experience some swelling and bruising in the area of the incisions.  Ice packs will help.  Swelling and bruising can take several days to resolve.   It is common to experience some constipation after surgery.  Increasing fluid intake and taking a stool softener (such as Colace) will usually help or prevent this problem from occurring.  A mild laxative (Milk of Magnesia or Miralax) should be taken according to package instructions if there has been no bowel movement after 48 hours.  You will likely have Dermabond (topical glue) over your incisions.  This seals the incisions and allows you to bathe and shower at any time after your surgery.  Glue should remain in place for up to 10 days.  It may be removed after 10 days by pealing off the Dermabond material or using Vaseline or naval jelly to remove.  If you have steri-strips over your incisions, you may remove the gauze bandage on the second day after surgery, and you may shower at that time.  Leave your steri-strips (small skin tapes) in place directly over the incision.  These strips should remain on the  skin for 5-7 days and then be removed.  You may get them wet in the shower and pat them dry.  Any sutures or staples will be removed at the office during your follow-up visit.  ACTIVITIES:  You may resume regular (light) daily activities beginning the next day - such as daily self-care, walking, climbing stairs - gradually increasing activities as tolerated.  You may have sexual intercourse when it is comfortable.  Refrain from any heavy lifting or straining until approved by your doctor.  You may drive when you are no longer taking prescription pain medication, when you can comfortably wear a seatbelt, and when you can safely maneuver your car and apply brakes.  You should see your doctor in the office for a follow-up appointment approximately 2-3 weeks after your surgery.  Make sure that you call for this appointment within a day or two after you arrive home to insure a convenient appointment time.  WHEN TO CALL YOUR DOCTOR: Fever over 101.0 Inability to urinate Continued bleeding from incision Increased pain, redness, or drainage from the incision Increasing abdominal pain  The clinic staff is available to answer your questions during regular business hours.  Please don't hesitate to call and ask to speak to one of the nurses for clinical concerns.  If you have a medical emergency, go to the nearest emergency room or call 911.  A surgeon from [facility] is always on call for the hospital.  Darnell Level, MD [facility] Office: [phone] Toll Free:  [phone] FAX [phone]  Website: [email or web address]

## 2024-04-02 NOTE — Transfer of Care (Signed)
 Immediate Anesthesia Transfer of Care Note  Patient: Rhylan Gross  Procedure(s) Performed: Procedure(s): LAPAROSCOPIC CHOLECYSTECTOMY WITH INTRAOPERATIVE CHOLANGIOGRAM (N/A)  Patient Location: PACU  Anesthesia Type:General  Level of Consciousness: Patient easily awoken, comfortable, cooperative, following commands, responds to stimulation.   Airway & Oxygen Therapy: Patient spontaneously breathing, ventilating well, oxygen via simple oxygen mask.  Post-op Assessment: Report given to PACU RN, vital signs reviewed and stable, moving all extremities.   Post vital signs: Reviewed and stable.  Complications: No apparent anesthesia complications Last Vitals:  Vitals Value Taken Time  BP 138/110 04/02/24 1045  Temp    Pulse 68 04/02/24 1048  Resp 14 04/02/24 1048  SpO2 100 % 04/02/24 1048  Vitals shown include unfiled device data.  Last Pain:  Vitals:   04/02/24 0803  TempSrc:   PainSc: 0-No pain      Patients Stated Pain Goal: 5 (04/02/24 0803)  Complications: No notable events documented.

## 2024-04-03 ENCOUNTER — Encounter (HOSPITAL_COMMUNITY): Payer: Self-pay | Admitting: Surgery

## 2024-04-06 LAB — SURGICAL PATHOLOGY

## 2024-05-19 ENCOUNTER — Encounter: Payer: Self-pay | Admitting: Adult Health

## 2024-05-19 ENCOUNTER — Ambulatory Visit: Payer: BC Managed Care – PPO | Admitting: Adult Health

## 2024-05-19 VITALS — BP 121/80 | Ht 64.5 in | Wt 205.0 lb

## 2024-05-19 DIAGNOSIS — G4733 Obstructive sleep apnea (adult) (pediatric): Secondary | ICD-10-CM

## 2024-05-19 NOTE — Progress Notes (Signed)
 PATIENT: Rebecca Armstrong DOB: 04/20/1981  REASON FOR VISIT: follow up HISTORY FROM: patient PRIMARY NEUROLOGIST: Dr. Buck  Chief Complaint  Patient presents with   Follow-up    RM6 Cpap, alone ESS-13 Pt stated--have not use cpap for 6 months due Gallbladder issue and start using for 3 nights.     HISTORY OF PRESENT ILLNESS: Today 05/19/24:  Rebecca Armstrong is a 43 y.o. female with a history of OSA on CPAP. Returns today for follow-up.  Patient reports that she stopped using CPAP as she was having trouble with her gallbladder.  She has now had surgery and recovery and has restarted the CPAP.  She does wear a fullface mask.  She does find this irritating at times.  Her download is below         08/26/23: Rebecca Armstrong is a 43 y.o. female with a history of obstructive sleep apnea on CPAP. Returns today for follow-up.  This is the patient's initial CPAP compliance visit.  She was diagnosed with moderate sleep apnea.  Her download is below.  She states that she is trying to stay consistent as she would like her fatigue to improve.  Not sure that she has noticed the benefit yet.        REVIEW OF SYSTEMS: Out of a complete 14 system review of symptoms, the patient complains only of the following symptoms, and all other reviewed systems are negative.   ALLERGIES: Allergies  Allergen Reactions   Dilaudid  [Hydromorphone  Hcl] Nausea And Vomiting   Adhesive [Tape] Itching and Rash   Codeine Nausea And Vomiting   Keflex  [Cephalexin ] Rash    rash all over abdomen    HOME MEDICATIONS: Outpatient Medications Prior to Visit  Medication Sig Dispense Refill   B Complex-C (SUPER B COMPLEX PO) Take 1 tablet 3 (three) times a week by mouth.      DULoxetine  (CYMBALTA ) 60 MG capsule Take 60 mg by mouth daily.     eletriptan  (RELPAX ) 40 MG tablet Take 40 mg by mouth as needed for migraine or headache. May repeat in 2 hours if headache persists or recurs.     lisdexamfetamine (VYVANSE)  40 MG capsule Take 40 mg by mouth every morning.     losartan (COZAAR) 50 MG tablet Take 50 mg by mouth daily.     Multiple Vitamin (MULTIVITAMIN ADULT PO) Take 1 tablet by mouth daily.     Probiotic Product (ALIGN PO) Take 1 capsule by mouth daily.     traMADol  (ULTRAM ) 50 MG tablet Take 1-2 tablets (50-100 mg total) by mouth every 6 (six) hours as needed for moderate pain (pain score 4-6). 15 tablet 0   No facility-administered medications prior to visit.    PAST MEDICAL HISTORY: Past Medical History:  Diagnosis Date   ADHD    Complication of anesthesia    Depression    celexa  20mg , restarted after twins were born and stayed on it.   Dysrhythmia    history of palpitations-EKG and workup in 04/2011--NSR with PAC's-avoid stimulants   Family hx of melanoma    GERD (gastroesophageal reflux disease)    History of kidney stones    Hyperlipidemia    Hypertension    Migraine    Newborn product of IVF pregnancy    PONV (postoperative nausea and vomiting)     PAST SURGICAL HISTORY: Past Surgical History:  Procedure Laterality Date   BREAST BIOPSY  11/19/2022   MM LT RADIOACTIVE SEED LOC MAMMO GUIDE 11/19/2022 [facility]  BREAST BIOPSY  11/19/2022   MM LT RADIOACTIVE SEED EA ADD LESION LOC MAMMO GUIDE 11/19/2022 [facility]   BREAST BIOPSY  11/19/2022   MM LT RADIOACTIVE SEED EA ADD LESION LOC MAMMO GUIDE 11/19/2022 [facility]   CESAREAN SECTION  09/01/2012   Procedure: CESAREAN SECTION;  Surgeon: Nathanel LELON Bunker, MD;  Location: [facility];  Service: Obstetrics;  Laterality: N/A;  Primay c/s-TWINS   CESAREAN SECTION WITH BILATERAL TUBAL LIGATION Bilateral 08/19/2017   Procedure: CESAREAN SECTION WITH BILATERAL TUBAL LIGATION;  Surgeon: Bunker Nathanel, MD;  Location: [facility];  Service: Obstetrics;  Laterality: Bilateral;  Tracey RNFA   CHOLECYSTECTOMY N/A 04/02/2024   Procedure: LAPAROSCOPIC CHOLECYSTECTOMY WITH INTRAOPERATIVE CHOLANGIOGRAM;  Surgeon: Eletha Boas, MD;  Location: [facility];  Service: General;  Laterality: N/A;   CYSTOSCOPY/URETEROSCOPY/HOLMIUM LASER/STENT PLACEMENT Bilateral 10/10/2017   Procedure: CYSTOSCOPY/URETEROSCOPY/RETROGRADE PYELOGRAM/HOLMIUM LASER/STENT PLACEMENT;  Surgeon: Alvaro Hummer, MD;  Location: [facility];  Service: Urology;  Laterality: Bilateral;   egg retrieval  2013   IVF-Propofol    MOLE REMOVAL     RADIOACTIVE SEED GUIDED EXCISIONAL BREAST BIOPSY Left 11/20/2022   Procedure: RADIOACTIVE SEED GUIDED EXCISIONAL LEFT BREAST BIOPSY;  Surgeon: Aron Shoulders, MD;  Location: [facility];  Service: General;  Laterality: Left;    FAMILY HISTORY: Family History  Problem Relation Age of Onset   Alcohol  abuse Mother    Miscarriages / Stillbirths Mother    Fibromyalgia Mother    Hypertension Mother    Melanoma Father 68   Sudden death Paternal Aunt 36   Hypertension Maternal Grandmother    Multiple myeloma Maternal Grandfather 32   Breast cancer Paternal Grandmother 42   Depression Paternal Grandmother    Heart attack Paternal Grandfather 13   Transient ischemic attack Other        MG aunt   Hypertension Other        MGGM   Sleep apnea Neg Hx     SOCIAL HISTORY: Social History   Socioeconomic History   Marital status: Married    Spouse name: Not on file   Number of children: Not on file   Years of education: Not on file   Highest education level: Not on file  Occupational History   Not on file  Tobacco Use   Smoking status: Never    Passive exposure: Never   Smokeless tobacco: Never  Vaping Use   Vaping status: Never Used  Substance and Sexual Activity   Alcohol  use: Yes    Comment: rarley   Drug use: No   Sexual activity: Yes    Birth control/protection: Surgical  Other Topics Concern   Not on file  Social History Narrative   Not on file   Social Drivers of Health   Financial Resource Strain: Not on file  Food Insecurity: Not on file  Transportation Needs: Not on file   Physical Activity: Not on file  Stress: Not on file  Social Connections: Unknown (06/12/2020)   Received from [facility]   Social Connections    Frequency of Communication with Friends and Family: Not asked    Frequency of Social Gatherings with Friends and Family: Not asked  Intimate Partner Violence: Unknown (06/12/2020)   Received from [facility]   Intimate Partner Violence    Fear of Current or Ex-Partner: Not asked    Emotionally Abused: Not asked    Physically Abused: Not asked    Sexually Abused: Not asked      PHYSICAL EXAM  Vitals:   05/19/24 1044  BP: 121/80  SpO2: 92%  Weight: 205 lb (93 kg)  Height: 5' 4.5 (1.638 m)    Body mass index is 34.64 kg/m.  Generalized: Well developed, in no acute distress  Chest: Lungs clear to auscultation bilaterally  Neurological examination  Mentation: Alert oriented to time, place, history taking. Follows all commands speech and language fluent Cranial nerve II-XII: facial symmetry noted Gait and station: Gait is normal.    DIAGNOSTIC DATA (LABS, IMAGING, TESTING) - I reviewed patient records, labs, notes, testing and imaging myself where available.  Lab Results  Component Value Date   WBC 5.7 04/01/2024   HGB 12.0 04/01/2024   HCT 39.2 04/01/2024   MCV 82.0 04/01/2024   PLT 379 04/01/2024      Component Value Date/Time   NA 136 04/01/2024 0829   K 3.7 04/01/2024 0829   CL 103 04/01/2024 0829   CO2 26 04/01/2024 0829   GLUCOSE 102 (H) 04/01/2024 0829   BUN 18 04/01/2024 0829   CREATININE 0.52 04/01/2024 0829   CALCIUM  9.1 04/01/2024 0829   PROT 7.5 04/01/2024 0829   ALBUMIN 3.8 04/01/2024 0829   AST 18 04/01/2024 0829   ALT 16 04/01/2024 0829   ALKPHOS 69 04/01/2024 0829   BILITOT 0.5 04/01/2024 0829   GFRNONAA >60 04/01/2024 0829   GFRAA >60 10/08/2017 1134        ASSESSMENT AND PLAN 43 y.o. year old female  has a past medical history of ADHD, Complication of anesthesia, Depression,  Dysrhythmia, Family hx of melanoma, GERD (gastroesophageal reflux disease), History of kidney stones, Hyperlipidemia, Hypertension, Migraine, Newborn product of IVF pregnancy, and PONV (postoperative nausea and vomiting). here with:  OSA on CPAP  - CPAP compliance excellent - Good treatment of AHI  - Encourage patient to use CPAP nightly and > 4 hours each night - Mask refitting ordered - F/U in 1 year or sooner if needed     Duwaine Russell, MSN, NP-C 05/19/2024, 10:51 AM [facility] [address], [address] [place], [place] [address] [phone]

## 2024-05-19 NOTE — Patient Instructions (Addendum)
Continue using CPAP nightly and greater than 4 hours each night.  °Mask refitting °If your symptoms worsen or you develop new symptoms please let us know.  ° °

## 2024-05-24 NOTE — Progress Notes (Signed)
 RE: mask refit Received: 4 days ago New, Adine Neysa Nena GORMAN, RN; Joylene Carlean Sheree Leveda Viktoria Dortha Jackson Avelina; 1 other Received, thank you!     Previous Messages    ----- Message ----- From: Neysa Nena GORMAN, RN Sent: 05/20/2024   1:16 PM EDT To: Adine Joylene; Avelina Jackson; Ephraim Viktoria* Subject: mask refit                                    New order in epic.  Mask refitting  Rebecca Armstrong Female, 43 y.o., 25-Mar-1981 MRN: [id number]   Rebecca Armstrong

## 2024-07-25 ENCOUNTER — Encounter (HOSPITAL_BASED_OUTPATIENT_CLINIC_OR_DEPARTMENT_OTHER): Payer: Self-pay

## 2024-07-25 ENCOUNTER — Emergency Department (HOSPITAL_BASED_OUTPATIENT_CLINIC_OR_DEPARTMENT_OTHER)

## 2024-07-25 ENCOUNTER — Other Ambulatory Visit: Payer: Self-pay

## 2024-07-25 ENCOUNTER — Emergency Department (HOSPITAL_BASED_OUTPATIENT_CLINIC_OR_DEPARTMENT_OTHER)
Admission: EM | Admit: 2024-07-25 | Discharge: 2024-07-25 | Disposition: A | Discharge status: Home / Self Care | Attending: Emergency Medicine | Admitting: Emergency Medicine

## 2024-07-25 DIAGNOSIS — R1013 Epigastric pain: Secondary | ICD-10-CM | POA: Insufficient documentation

## 2024-07-25 DIAGNOSIS — R112 Nausea with vomiting, unspecified: Secondary | ICD-10-CM | POA: Insufficient documentation

## 2024-07-25 DIAGNOSIS — R197 Diarrhea, unspecified: Secondary | ICD-10-CM | POA: Insufficient documentation

## 2024-07-25 LAB — COMPREHENSIVE METABOLIC PANEL WITH GFR
ALT: 14 U/L (ref 0–44)
AST: 17 U/L (ref 15–41)
Albumin: 4.8 g/dL (ref 3.5–5.0)
Alkaline Phosphatase: 93 U/L (ref 38–126)
Anion gap: 15 (ref 5–15)
BUN: 14 mg/dL (ref 6–20)
CO2: 24 mmol/L (ref 22–32)
Calcium: 9.8 mg/dL (ref 8.9–10.3)
Chloride: 101 mmol/L (ref 98–111)
Creatinine, Ser: 0.64 mg/dL (ref 0.44–1.00)
GFR, Estimated: >60 mL/min (ref >60)
Glucose, Bld: 137 mg/dL — ABNORMAL HIGH (ref 70–99)
Potassium: 3.6 mmol/L (ref 3.5–5.1)
Sodium: 139 mmol/L (ref 135–145)
Total Bilirubin: 0.7 mg/dL (ref 0.0–1.2)
Total Protein: 8.4 g/dL — ABNORMAL HIGH (ref 6.5–8.1)

## 2024-07-25 LAB — CBC
HCT: 42.7 % (ref 36.0–46.0)
Hemoglobin: 13.9 g/dL (ref 12.0–15.0)
MCH: 25.7 pg — ABNORMAL LOW (ref 26.0–34.0)
MCHC: 32.6 g/dL (ref 30.0–36.0)
MCV: 79.1 fL — ABNORMAL LOW (ref 80.0–100.0)
Platelets: 428 K/uL — ABNORMAL HIGH (ref 150–400)
RBC: 5.4 MIL/uL — ABNORMAL HIGH (ref 3.87–5.11)
RDW: 14.2 % (ref 11.5–15.5)
WBC: 10.8 K/uL — ABNORMAL HIGH (ref 4.0–10.5)
nRBC: 0 % (ref 0.0–0.2)

## 2024-07-25 LAB — URINALYSIS, ROUTINE W REFLEX MICROSCOPIC
Bilirubin Urine: NEGATIVE
Glucose, UA: NEGATIVE mg/dL
Ketones, ur: 1 mg/dL — AB
Leukocytes,Ua: NEGATIVE
Nitrite: NEGATIVE
Protein, ur: 1 mg/dL — AB
Specific Gravity, Urine: >=1.03 (ref 1.005–1.030)
pH: 6 (ref 5.0–8.0)

## 2024-07-25 LAB — LIPASE, BLOOD: Lipase: 22 U/L (ref 11–51)

## 2024-07-25 LAB — PREGNANCY, URINE: Preg Test, Ur: NEGATIVE

## 2024-07-25 MED ORDER — LACTATED RINGERS IV BOLUS
1000.0000 mL | Freq: Once | INTRAVENOUS | Status: AC
  Administered 2024-07-25: 1000 mL via INTRAVENOUS

## 2024-07-25 MED ORDER — SODIUM CHLORIDE 0.9 % IV SOLN
12.5000 mg | Freq: Once | INTRAVENOUS | Status: AC
  Administered 2024-07-25: 12.5 mg via INTRAVENOUS
  Filled 2024-07-25: qty 0.5

## 2024-07-25 MED ORDER — PROMETHAZINE HCL 25 MG/ML IJ SOLN
INTRAMUSCULAR | Status: AC
  Filled 2024-07-25: qty 1

## 2024-07-25 MED ORDER — ONDANSETRON HCL 4 MG/2ML IJ SOLN
4.0000 mg | Freq: Once | INTRAMUSCULAR | Status: AC
  Administered 2024-07-25: 4 mg via INTRAVENOUS
  Filled 2024-07-25: qty 2

## 2024-07-25 MED ORDER — PROMETHAZINE HCL 25 MG PO TABS
12.5000 mg | ORAL_TABLET | Freq: Once | ORAL | Status: AC
  Administered 2024-07-25: 12.5 mg via ORAL
  Filled 2024-07-25: qty 1

## 2024-07-25 MED ORDER — IOHEXOL 300 MG/ML  SOLN
100.0000 mL | Freq: Once | INTRAMUSCULAR | Status: AC | PRN
  Administered 2024-07-25: 100 mL via INTRAVENOUS

## 2024-07-25 MED ORDER — LORAZEPAM 2 MG/ML IJ SOLN
0.5000 mg | Freq: Once | INTRAMUSCULAR | Status: AC
  Administered 2024-07-25: 0.5 mg via INTRAVENOUS
  Filled 2024-07-25: qty 1

## 2024-07-25 MED ORDER — ONDANSETRON 4 MG PO TBDP: 4.0000 mg | ORAL_TABLET | Freq: Three times a day (TID) | ORAL | 0 refills | Status: AC | PRN

## 2024-07-25 MED ORDER — PROMETHAZINE HCL 25 MG PO TABS: 25.0000 mg | ORAL_TABLET | Freq: Four times a day (QID) | ORAL | 0 refills | Status: AC | PRN

## 2024-07-25 NOTE — ED Notes (Signed)
 Pt given water and saltines for PO challenge. Pt states she wants to wait to see if Phenergan  is effective before attempting to eat/drink.

## 2024-07-25 NOTE — Discharge Instructions (Addendum)
 We suspect your symptoms are secondary to [facility].  Advise to discontinue this medication.  Take the nausea medication as prescribed.  Phenergan  can be sedating so do not take it if you are working or driving.  Start with a clear liquid diet and advance slowly as tolerated.  Follow-up with your doctor.  Return to the ED with new or worsening symptoms.

## 2024-07-25 NOTE — ED Provider Notes (Signed)
 [place] EMERGENCY DEPARTMENT AT [facility] Provider Note   CSN: [id number] Arrival date & time: 07/25/24  9985     Patient presents with: Vomiting   Rebecca Armstrong is a 43 y.o. female.   Patient here with nausea, vomiting, diarrhea and abdominal pain.  Had her first Wegovy dose about 2 days ago.  Has had nausea since 11 AM.  Did have 1 episode of vomiting.  Has had constant diarrhea all day approximately 10 episodes that are nonbloody.  Having diffuse abdominal cramping worse in her epigastrium.  No chest pain or shortness of breath.  No fever.  No pain with urination or blood in the urine.  Previous cholecystectomy.  Still has appendix.  This was her first Wegovy dose.  No travel or sick contacts.  Estimates she has had about 10 episodes of loose stools that has been nonbloody.  1 episode of vomiting.  No fever.  No chest pain or shortness of breath  The history is provided by the patient.       Prior to Admission medications   Medication Sig Start Date End Date Taking? Authorizing Provider  B Complex-C (SUPER B COMPLEX PO) Take 1 tablet 3 (three) times a week by mouth.     [provider]  DULoxetine  (CYMBALTA ) 60 MG capsule Take 60 mg by mouth daily.    [provider]  eletriptan  (RELPAX ) 40 MG tablet Take 40 mg by mouth as needed for migraine or headache. May repeat in 2 hours if headache persists or recurs.    [provider]  lisdexamfetamine (VYVANSE) 40 MG capsule Take 40 mg by mouth every morning.    [provider]  losartan (COZAAR) 50 MG tablet Take 50 mg by mouth daily.    [provider]  Multiple Vitamin (MULTIVITAMIN ADULT PO) Take 1 tablet by mouth daily.    [provider]  Probiotic Product (ALIGN PO) Take 1 capsule by mouth daily.    [provider]  traMADol  (ULTRAM ) 50 MG tablet Take 1-2 tablets (50-100 mg total) by mouth every 6 (six) hours as needed for moderate pain (pain score 4-6).  04/02/24   Eletha Boas, MD    Allergies: Dilaudid  [hydromorphone  hcl], Adhesive [tape], Codeine, and Keflex  [cephalexin ]    Review of Systems  Constitutional:  Positive for activity change and appetite change. Negative for fever.  HENT:  Negative for congestion and rhinorrhea.   Respiratory:  Negative for cough, chest tightness and shortness of breath.   Cardiovascular:  Negative for chest pain.  Gastrointestinal:  Positive for abdominal pain, diarrhea, nausea and vomiting. Negative for constipation.  Genitourinary:  Negative for dysuria and hematuria.  Musculoskeletal:  Negative for arthralgias and myalgias.  Skin:  Negative for rash.  Neurological:  Negative for dizziness, weakness and headaches.   all other systems are negative except as noted in the HPI and PMH.    Updated Vital Signs BP (!) 154/94 (BP Location: Right Arm)   Pulse 91   Temp 97.6 F (36.4 C) (Oral)   Resp 18   Ht 5' 5 (1.651 m)   Wt 93.9 kg   LMP 07/23/2024   SpO2 100%   BMI 34.45 kg/m   Physical Exam Vitals and nursing note reviewed.  Constitutional:      General: She is not in acute distress.    Appearance: She is well-developed.  HENT:     Head: Normocephalic and atraumatic.     Mouth/Throat:     Pharynx:  No oropharyngeal exudate.  Eyes:     Conjunctiva/sclera: Conjunctivae normal.     Pupils: Pupils are equal, round, and reactive to light.  Neck:     Comments: No meningismus. Cardiovascular:     Rate and Rhythm: Normal rate and regular rhythm.     Heart sounds: Normal heart sounds. No murmur heard. Pulmonary:     Effort: Pulmonary effort is normal. No respiratory distress.     Breath sounds: Normal breath sounds.  Abdominal:     Palpations: Abdomen is soft.     Tenderness: There is abdominal tenderness. There is no guarding or rebound.     Comments: Epigastric tenderness  Musculoskeletal:        General: No tenderness. Normal range of motion.     Cervical back: Normal range of motion  and neck supple.  Skin:    General: Skin is warm.  Neurological:     Mental Status: She is alert and oriented to person, place, and time.     Cranial Nerves: No cranial nerve deficit.     Motor: No abnormal muscle tone.     Coordination: Coordination normal.     Comments:  5/5 strength throughout. CN 2-12 intact.Equal grip strength.   Psychiatric:        Behavior: Behavior normal.     (all labs ordered are listed, but only abnormal results are displayed) Labs Reviewed  COMPREHENSIVE METABOLIC PANEL WITH GFR - Abnormal; Notable for the following components:      Result Value   Glucose, Bld 137 (*)    Total Protein 8.4 (*)    All other components within normal limits  CBC - Abnormal; Notable for the following components:   WBC 10.8 (*)    RBC 5.40 (*)    MCV 79.1 (*)    MCH 25.7 (*)    Platelets 428 (*)    All other components within normal limits  URINALYSIS, ROUTINE W REFLEX MICROSCOPIC - Abnormal; Notable for the following components:   Hgb urine dipstick LARGE (*)    Ketones, ur 1 (*)    Protein, ur 1 (*)    All other components within normal limits  LIPASE, BLOOD  PREGNANCY, URINE    EKG: EKG Interpretation Date/Time:  Sunday July 25 2024 01:05:03 EDT Ventricular Rate:  79 PR Interval:  136 QRS Duration:  94 QT Interval:  380 QTC Calculation: 436 R Axis:   55  Text Interpretation: Sinus rhythm No significant change was found Confirmed by Carita Senior [phone]) on 07/25/2024 1:28:43 AM  Radiology: CT ABDOMEN PELVIS W CONTRAST Result Date: 07/25/2024 CLINICAL DATA:  Abdominal pain, acute, nonlocalized EXAM: CT ABDOMEN AND PELVIS WITH CONTRAST TECHNIQUE: Multidetector CT imaging of the abdomen and pelvis was performed using the standard protocol following bolus administration of intravenous contrast. RADIATION DOSE REDUCTION: This exam was performed according to the departmental dose-optimization program which includes automated exposure control, adjustment of  the mA and/or kV according to patient size and/or use of iterative reconstruction technique. CONTRAST:  OMNIPAQUE  IOHEXOL  300 MG/ML  SOLN COMPARISON:  None Available. FINDINGS: Lower chest: No acute abnormality. Hepatobiliary: No focal liver abnormality. Status post cholecystectomy. No biliary dilatation. Pancreas: No focal lesion. Normal pancreatic contour. No surrounding inflammatory changes. No main pancreatic ductal dilatation. Spleen: Normal in size without focal abnormality. Adrenals/Urinary Tract: No adrenal nodule bilaterally. Bilateral kidneys enhance symmetrically. 3 mm right and punctate left nephrolithiasis. No ureterolithiasis bilaterally. No hydroureteronephrosis. The urinary bladder is unremarkable. Stomach/Bowel: Stomach is within normal  limits. No evidence of bowel wall thickening or dilatation. Colonic diverticulosis. Appendix appears normal. Vascular/Lymphatic: No abdominal aorta or iliac aneurysm. No abdominal, pelvic, or inguinal lymphadenopathy. Reproductive: Uterus and bilateral adnexa are unremarkable. Other: No intraperitoneal free fluid. No intraperitoneal free gas. No organized fluid collection. Musculoskeletal: No abdominal wall hernia or abnormality. No suspicious lytic or blastic osseous lesions. No acute displaced fracture. IMPRESSION: 1. Nonobstructive bilateral nephrolithiasis measuring up to 3 mm on the right and punctate on the left. 2. Colonic diverticulosis with no acute diverticulitis. 3. Status post cholecystectomy. Electronically Signed   By: Morgane  Naveau M.D.   On: 07/25/2024 01:47     Procedures   Medications Ordered in the ED  lactated ringers  bolus 1,000 mL (has no administration in time range)  lactated ringers  bolus 1,000 mL (1,000 mLs Intravenous New Bag/Given 07/25/24 0059)  ondansetron  (ZOFRAN ) injection 4 mg (4 mg Intravenous Given 07/25/24 0059)                                    Medical Decision Making Amount and/or Complexity of Data  Reviewed Labs: ordered. Decision-making details documented in ED Course. Radiology: ordered and independent interpretation performed. Decision-making details documented in ED Course. ECG/medicine tests: ordered and independent interpretation performed. Decision-making details documented in ED Course.  Risk Prescription drug management.   Nausea, vomiting, diarrhea in the setting of Wegovy use.  Stable vitals.  Abdomen soft without peritoneal signs. Will Hydrate, check labs, and UA.  Suspect symptoms likely secondary to [facility].  Abdomen soft without peritoneal signs.  Labs are reassuring without evidence of AKI.  Normal electrolytes.  No significant leukocytosis.  CT scan reassuring. bilateral nephrolithiasis without obstruction.  Diverticulosis and previous cholecystectomy.  UA with hematuria without faction.  She denies flank pain.  Consider possible passed kidney stone.  No further vomiting throughout ED course.  She is able to tolerate p.o. fluids.  Suspect symptoms secondary to [facility]. advised to discontinue this medication. She has Zofran  at home.  Will give Phenergan  as well and she is cautioned to avoid this medication in case she is working or driving as it is sedating. Follow-up with her doctor. Start with a clear liquid diet and advance slowly as tolerated. Return to the ED with new or worsening symptoms.      Final diagnoses:  Nausea and vomiting, unspecified vomiting type    ED Discharge Orders     None          Roniqua Kintz, Garnette, MD 07/25/24 [phone]

## 2024-07-25 NOTE — ED Triage Notes (Signed)
 Patient here POV from Home.  Endorses 1st dose of Wegovy 48 hours ago. Nausea since 1100. Zofran  not very effective earlier today. Some diarrhea at 2000. No known Fevers. Some ABD discomfort.   NAD noted during triage. A&Ox4. Gcs 15. Ambulatory.

## 2024-07-26 ENCOUNTER — Ambulatory Visit: Admitting: Dermatology

## 2024-07-26 ENCOUNTER — Encounter: Payer: Self-pay | Admitting: Dermatology

## 2024-07-26 VITALS — BP 137/88 | HR 102

## 2024-07-26 DIAGNOSIS — L820 Inflamed seborrheic keratosis: Secondary | ICD-10-CM | POA: Diagnosis not present

## 2024-07-26 DIAGNOSIS — D485 Neoplasm of uncertain behavior of skin: Secondary | ICD-10-CM | POA: Diagnosis not present

## 2024-07-26 DIAGNOSIS — B079 Viral wart, unspecified: Secondary | ICD-10-CM

## 2024-07-26 DIAGNOSIS — L738 Other specified follicular disorders: Secondary | ICD-10-CM

## 2024-07-26 DIAGNOSIS — L821 Other seborrheic keratosis: Secondary | ICD-10-CM | POA: Diagnosis not present

## 2024-07-26 NOTE — Progress Notes (Signed)
 New Patient Visit   Subjective  Rebecca Armstrong is a 43 y.o. female who presents for the following:  Patient sees Dr. Joshua at [facility]. She comes to our office due to insurance changes.  Spots on face are not painful and have been there for 1 year. The spot on her leg has been there for over a year and has been larger but she feels like it has gotten smaller.   The following portions of the chart were reviewed this encounter and updated as appropriate: medications, allergies, medical history  Review of Systems:  No other skin or systemic complaints except as noted in HPI or Assessment and Plan.  Objective  Well appearing patient in no apparent distress; mood and affect are within normal limits.  A focused examination was performed of the following areas: Face and right leg  Relevant exam findings are noted in the Assessment and Plan.  Right Thigh - Posterior 4mm pink papule  Right Breast Inflamed stuck on papule   Assessment & Plan   Sebaceous Hyperplasia - Small yellow papules with a central dell - Benign-appearing - Observe. Call for changes.   INFLAMED SEBORRHEIC KERATOSIS Exam: Erythematous keratotic or waxy stuck-on papule or plaque.  Symptomatic, irritating, patient would like treated.  Benign-appearing.  Call clinic for new or changing lesions.   Prior to procedure, discussed risks of blister formation, small wound, skin dyspigmentation, or rare scar following treatment. Recommend Vaseline ointment to treated areas while healing.  Destruction Procedure Note Destruction method: cryotherapy   Informed consent: discussed and consent obtained   Lesion destroyed using liquid nitrogen: Yes   Outcome: patient tolerated procedure well with no complications   Post-procedure details: wound care instructions given   Locations: right chest # of Lesions Treated: 1   SEBORRHEIC KERATOSIS - Stuck-on, waxy, tan-brown papules and/or plaques  -  Benign-appearing - Discussed benign etiology and prognosis. - Observe - Call for any changes  NEOPLASM OF UNCERTAIN BEHAVIOR OF SKIN Right Thigh - Posterior Skin / nail biopsy Type of biopsy: tangential   Informed consent: discussed and consent obtained   Timeout: patient name, date of birth, surgical site, and procedure verified   Procedure prep:  Patient was prepped and draped in usual sterile fashion Prep type:  Isopropyl alcohol  Anesthesia: the lesion was anesthetized in a standard fashion   Anesthetic:  1% lidocaine  w/ epinephrine  1-100,000 buffered w/ 8.4% NaHCO3 Instrument used: DermaBlade   Hemostasis achieved with: aluminum chloride   Outcome: patient tolerated procedure well   Post-procedure details: sterile dressing applied and wound care instructions given   Dressing type: bandage and petrolatum    Specimen 1 - Surgical pathology Differential Diagnosis: r/o NMSC vs other  Check Margins: No INFLAMED SEBORRHEIC KERATOSIS Right Breast Destruction of lesion - Right Breast Complexity: simple   Destruction method: cryotherapy   Informed consent: discussed and consent obtained   Timeout:  patient name, date of birth, surgical site, and procedure verified Lesion destroyed using liquid nitrogen: Yes   Region frozen until ice ball extended beyond lesion: Yes   Cryotherapy cycles:  1 Outcome: patient tolerated procedure well with no complications   Post-procedure details: wound care instructions given    SEBACEOUS HYPERPLASIA OF FACE   SEBORRHEIC KERATOSES    Return for FBSE.  LILLETTE Rollene Gobble, RN, am acting as scribe for RUFUS CHRISTELLA HOLY, MD .   Documentation: I have reviewed the above documentation for accuracy and completeness, and I agree with the above.  Joby Richart  CHRISTELLA HOLY, MD

## 2024-07-26 NOTE — Patient Instructions (Signed)
 Patient Handout: Wound Care for Skin Biopsy Site  Taking Care of Your Skin Biopsy Site  Proper care of the biopsy site is essential for promoting healing and minimizing scarring. This handout provides instructions on how to care for your biopsy site to ensure optimal recovery.  1. Cleaning the Wound:  Clean the biopsy site daily with gentle soap and water. Gently pat the area dry with a clean, soft towel. Avoid harsh scrubbing or rubbing the area, as this can irritate the skin and delay healing.  2. Applying Aquaphor and Bandage:  After cleaning the wound, apply a thin layer of Aquaphor ointment to the biopsy site. Cover the area with a sterile bandage to protect it from dirt, bacteria, and friction. Change the bandage daily or as needed if it becomes soiled or wet.  3. Continued Care for One Week:  Repeat the cleaning, Aquaphor application, and bandaging process daily for one week following the biopsy procedure. Keeping the wound clean and moist during this initial healing period will help prevent infection and promote optimal healing.  4. Massaging Aquaphor into the Area:  ---After one week, discontinue the use of bandages but continue to apply Aquaphor to the biopsy site. ----Gently massage the Aquaphor into the area using circular motions. ---Massaging the skin helps to promote circulation and prevent the formation of scar tissue.   Additional Tips:  Avoid exposing the biopsy site to direct sunlight during the healing process, as this can cause hyperpigmentation or worsen scarring. If you experience any signs of infection, such as increased redness, swelling, warmth, or drainage from the wound, contact your healthcare provider immediately. Follow any additional instructions provided by your healthcare provider for caring for the biopsy site and managing any discomfort. Conclusion:  Taking proper care of your skin biopsy site is crucial for ensuring optimal healing and  minimizing scarring. By following these instructions for cleaning, applying Aquaphor, and massaging the area, you can promote a smooth and successful recovery. If you have any questions or concerns about caring for your biopsy site, don't hesitate to contact your healthcare provider for guidance.   For areas treated with Liquid Nitrogen:  Keep clean with soap and water.  Apply Vaseline or Aquaphor twice daily.   Important Information  Due to recent changes in healthcare laws, you may see results of your pathology and/or laboratory studies on MyChart before the doctors have had a chance to review them. We understand that in some cases there may be results that are confusing or concerning to you. Please understand that not all results are received at the same time and often the doctors may need to interpret multiple results in order to provide you with the best plan of care or course of treatment. Therefore, we ask that you please give us  2 business days to thoroughly review all your results before contacting the office for clarification. Should we see a critical lab result, you will be contacted sooner.   If You Need Anything After Your Visit  If you have any questions or concerns for your doctor, please call our main line at [phone] If no one answers, please leave a voicemail as directed and we will return your call as soon as possible. Messages left after 4 pm will be answered the following business day.   You may also send us  a message via MyChart. We typically respond to MyChart messages within 1-2 business days.  For prescription refills, please ask your pharmacy to contact our office. Our fax number  is [phone].  If you have an urgent issue when the clinic is closed that cannot wait until the next business day, you can page your doctor at the number below.    Please note that while we do our best to be available for urgent issues outside of office hours, we are not available 24/7.    If you have an urgent issue and are unable to reach us , you may choose to seek medical care at your doctor's office, retail clinic, urgent care center, or emergency room.  If you have a medical emergency, please immediately call 911 or go to the emergency department. In the event of inclement weather, please call our main line at [phone] for an update on the status of any delays or closures.  Dermatology Medication Tips: Please keep the boxes that topical medications come in in order to help keep track of the instructions about where and how to use these. Pharmacies typically print the medication instructions only on the boxes and not directly on the medication tubes.   If your medication is too expensive, please contact our office at [phone] or send us  a message through MyChart.   We are unable to tell what your co-pay for medications will be in advance as this is different depending on your insurance coverage. However, we may be able to find a substitute medication at lower cost or fill out paperwork to get insurance to cover a needed medication.   If a prior authorization is required to get your medication covered by your insurance company, please allow us  1-2 business days to complete this process.  Drug prices often vary depending on where the prescription is filled and some pharmacies may offer cheaper prices.  The website www.goodrx.com contains coupons for medications through different pharmacies. The prices here do not account for what the cost may be with help from insurance (it may be cheaper with your insurance), but the website can give you the price if you did not use any insurance.  - You can print the associated coupon and take it with your prescription to the pharmacy.  - You may also stop by our office during regular business hours and pick up a GoodRx coupon card.  - If you need your prescription sent electronically to a different pharmacy, notify our office  through [facility] or by phone at [phone]

## 2024-07-27 LAB — SURGICAL PATHOLOGY

## 2024-07-28 ENCOUNTER — Ambulatory Visit: Payer: Self-pay | Admitting: Dermatology

## 2024-09-08 ENCOUNTER — Ambulatory Visit: Admitting: Dermatology

## 2025-03-01 ENCOUNTER — Telehealth: Admitting: Adult Health
